# Patient Record
Sex: Female | Born: 1935
Health system: Southern US, Community
[De-identification: ages and names within clinical notes are randomized; demographics above are authoritative.]

---

## 2004-10-20 ENCOUNTER — Ambulatory Visit: Payer: Self-pay | Admitting: Family Medicine

## 2005-02-07 ENCOUNTER — Ambulatory Visit: Payer: Self-pay | Admitting: Family Medicine

## 2005-08-25 ENCOUNTER — Ambulatory Visit: Payer: Self-pay | Admitting: Family Medicine

## 2005-08-28 ENCOUNTER — Ambulatory Visit: Payer: Self-pay | Admitting: Family Medicine

## 2006-03-05 ENCOUNTER — Emergency Department: Payer: Self-pay | Admitting: Emergency Medicine

## 2006-08-23 ENCOUNTER — Ambulatory Visit: Payer: Self-pay

## 2006-09-08 ENCOUNTER — Ambulatory Visit: Payer: Self-pay

## 2006-09-20 ENCOUNTER — Ambulatory Visit: Payer: Self-pay | Admitting: Family Medicine

## 2006-09-28 ENCOUNTER — Ambulatory Visit: Payer: Self-pay | Admitting: Unknown Physician Specialty

## 2006-10-19 ENCOUNTER — Ambulatory Visit: Payer: Self-pay | Admitting: Unknown Physician Specialty

## 2007-03-04 ENCOUNTER — Emergency Department: Payer: Self-pay | Admitting: Emergency Medicine

## 2007-12-14 ENCOUNTER — Ambulatory Visit: Payer: Self-pay | Admitting: Family Medicine

## 2008-01-04 ENCOUNTER — Ambulatory Visit: Payer: Self-pay | Admitting: Family Medicine

## 2008-06-27 IMAGING — CR DG CHEST 2V
1 series · 2 of 2 positions shown · non-contrast
Comparison: none

REASON FOR EXAM: XRAY CHEST FOR SHORTNESS OF BREATH, COUGH
COMMENTS:

[Series 1: view not recorded · 0.17mm/px · 2 of 2 slices shown]
[im 1/2]
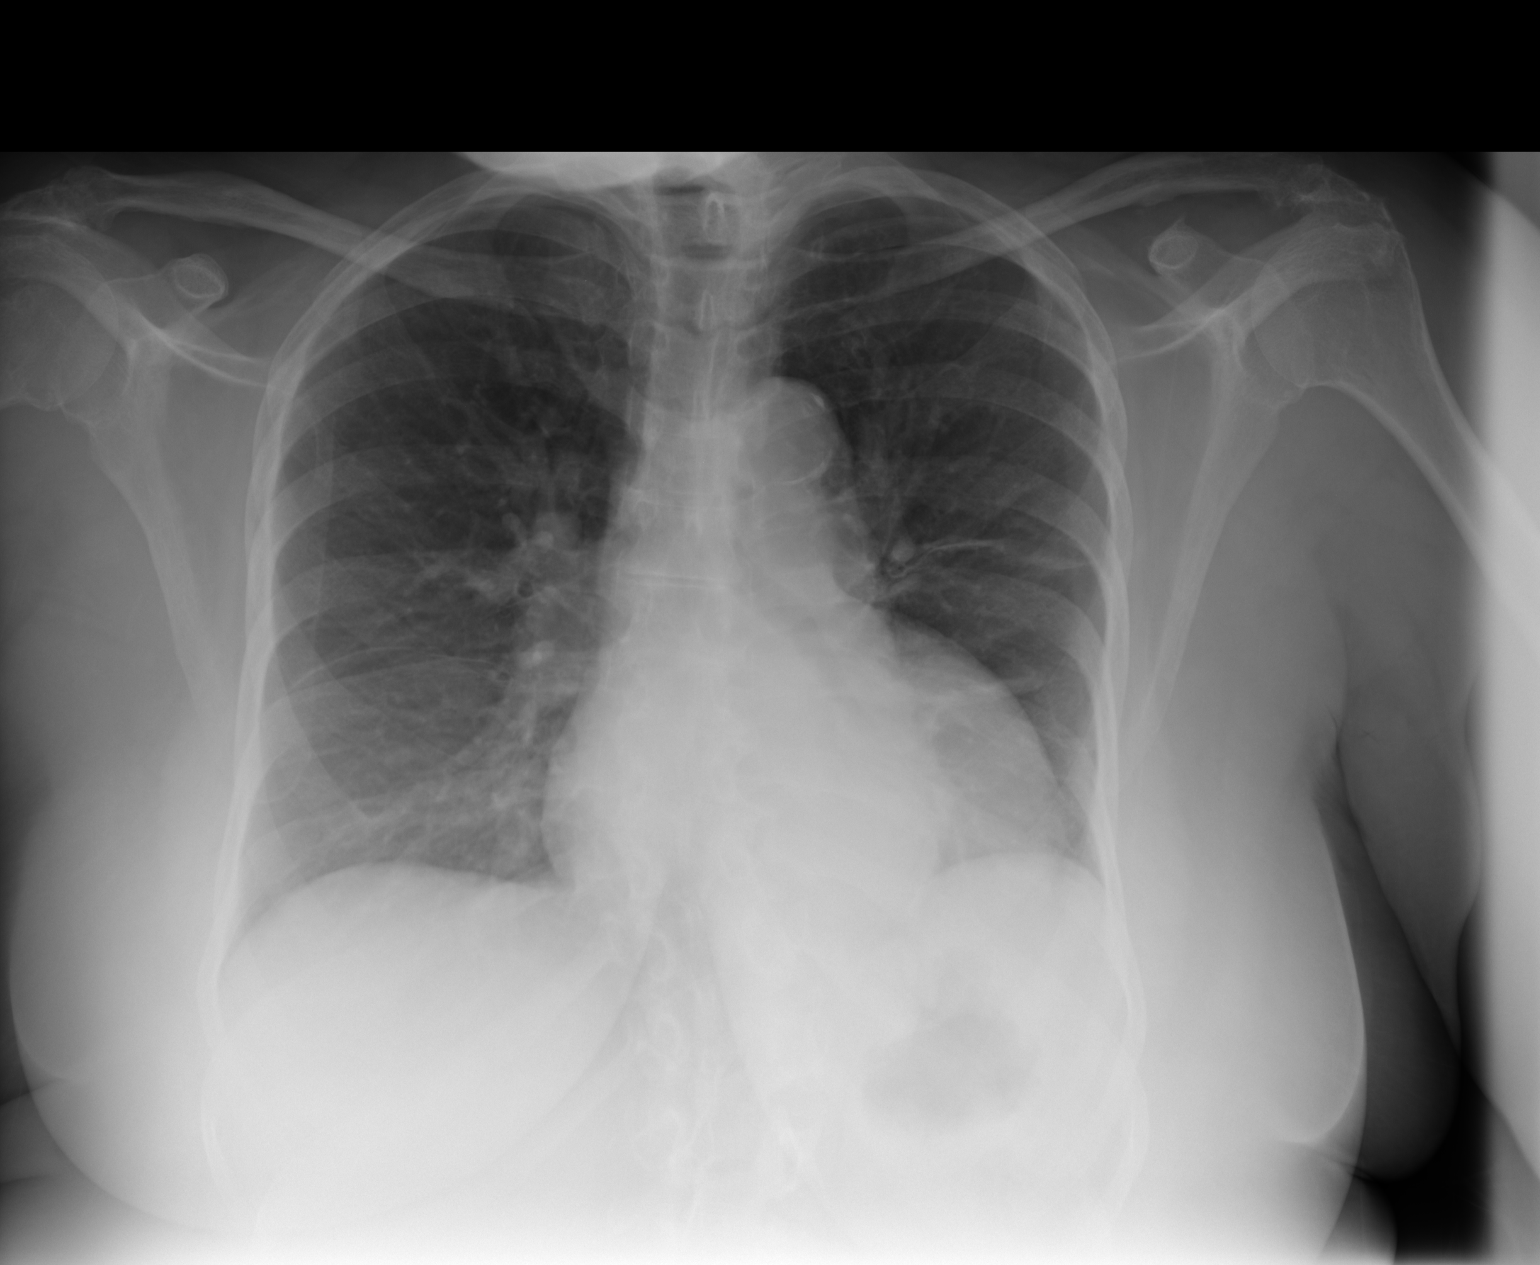
[im 2/2]
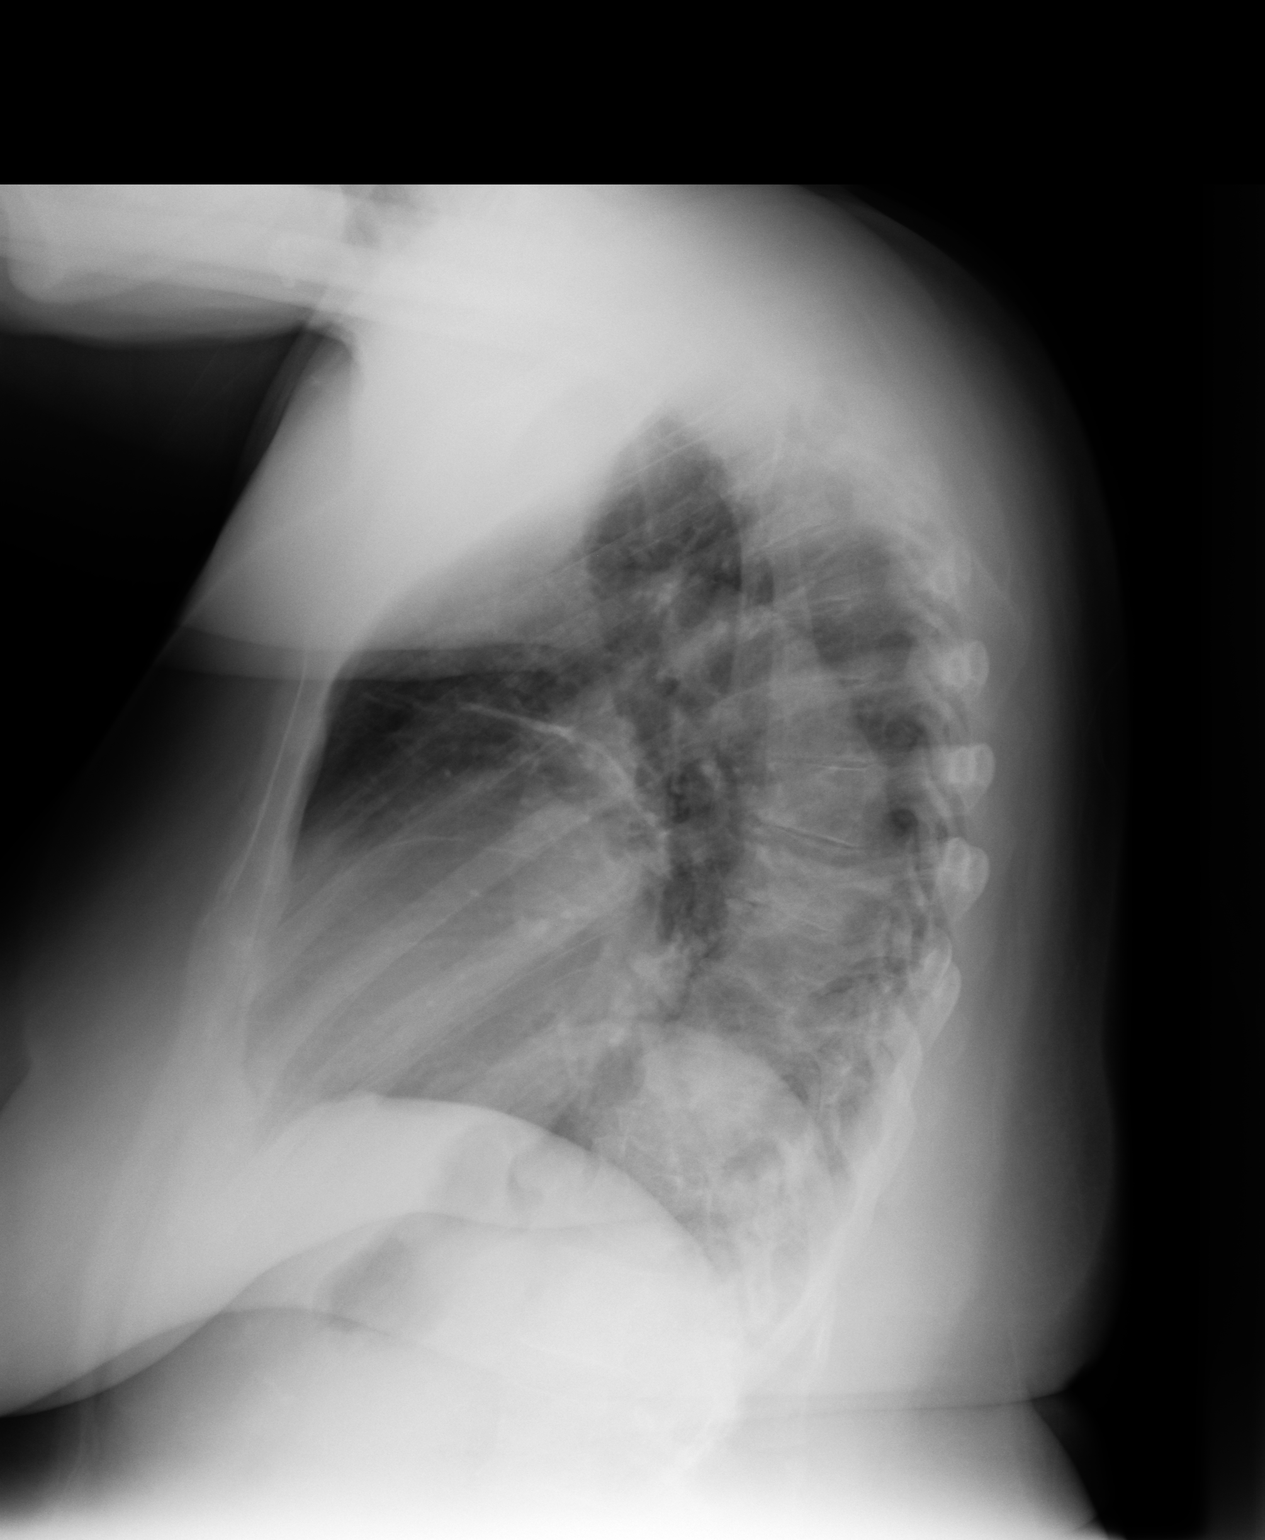

[2 of 2 positions shown; findings below may reference images not displayed]

PROCEDURE:     DXR - DXR CHEST PA (OR AP) AND LATERAL  - August 23, 2006  [DATE]

RESULT:     No prior chest radiographs for comparison.  The current exam
shows prominence of the RIGHT hilum.  This is likely secondary to prominence
of the RIGHT pulmonary artery but CT would be needed to exclude adenopathy.
There are a few linear densities in the LEFT midlung field compatible with
fibrosis or discoid atelectasis.  No pneumonia is seen.  The heart appears
slightly enlarged.  No pleural effusion or pneumothorax is seen.  No acute
bony abnormalities are identified. There is a slight thoracic scoliosis with
convexity to the RIGHT.
IMPRESSION: No pneumonia is identified.

There are a few linear densities in the LEFT midlung field compatible with
fibrosis or discoid atelectasis.

There is prominence of the RIGHT hilum.  This likely is secondary to a
mildly prominent RIGHT pulmonary artery but CT would be recommended to
exclude adenopathy if such is a clinical concern.

## 2008-09-29 ENCOUNTER — Emergency Department: Payer: Self-pay | Admitting: Emergency Medicine

## 2009-01-21 ENCOUNTER — Ambulatory Visit: Payer: Self-pay | Admitting: Internal Medicine

## 2009-08-26 ENCOUNTER — Ambulatory Visit: Payer: Self-pay | Admitting: Family Medicine

## 2010-05-09 ENCOUNTER — Ambulatory Visit: Payer: Self-pay | Admitting: Oncology

## 2010-06-05 ENCOUNTER — Ambulatory Visit: Payer: Self-pay | Admitting: Oncology

## 2010-06-09 ENCOUNTER — Ambulatory Visit: Payer: Self-pay | Admitting: Oncology

## 2010-08-09 ENCOUNTER — Ambulatory Visit: Payer: Self-pay | Admitting: Oncology

## 2010-08-14 ENCOUNTER — Ambulatory Visit: Payer: Self-pay | Admitting: Oncology

## 2010-09-09 ENCOUNTER — Ambulatory Visit: Payer: Self-pay | Admitting: Oncology

## 2010-10-09 ENCOUNTER — Ambulatory Visit: Payer: Self-pay | Admitting: Oncology

## 2010-11-09 ENCOUNTER — Ambulatory Visit: Payer: Self-pay | Admitting: Oncology

## 2010-12-10 ENCOUNTER — Ambulatory Visit: Payer: Self-pay | Admitting: Oncology

## 2011-01-08 ENCOUNTER — Ambulatory Visit: Payer: Self-pay | Admitting: Oncology

## 2011-02-08 ENCOUNTER — Ambulatory Visit: Payer: Self-pay | Admitting: Oncology

## 2011-03-10 ENCOUNTER — Ambulatory Visit: Payer: Self-pay | Admitting: Oncology

## 2011-03-10 ENCOUNTER — Ambulatory Visit: Payer: Self-pay | Admitting: Internal Medicine

## 2011-04-10 ENCOUNTER — Ambulatory Visit: Payer: Self-pay | Admitting: Oncology

## 2011-05-10 ENCOUNTER — Ambulatory Visit: Payer: Self-pay | Admitting: Oncology

## 2011-06-10 ENCOUNTER — Ambulatory Visit: Payer: Self-pay | Admitting: Oncology

## 2011-06-21 ENCOUNTER — Inpatient Hospital Stay: Payer: Self-pay | Admitting: Internal Medicine

## 2011-06-25 LAB — PATHOLOGY REPORT

## 2011-06-29 ENCOUNTER — Encounter: Payer: Self-pay | Admitting: Internal Medicine

## 2011-07-11 ENCOUNTER — Encounter: Payer: Self-pay | Admitting: Internal Medicine

## 2011-07-11 ENCOUNTER — Ambulatory Visit: Payer: Self-pay | Admitting: Oncology

## 2011-07-29 ENCOUNTER — Ambulatory Visit: Payer: Self-pay | Admitting: Gastroenterology

## 2011-08-10 ENCOUNTER — Ambulatory Visit: Payer: Self-pay | Admitting: Oncology

## 2011-08-10 ENCOUNTER — Encounter: Payer: Self-pay | Admitting: Internal Medicine

## 2011-09-10 ENCOUNTER — Ambulatory Visit: Payer: Self-pay | Admitting: Oncology

## 2011-09-10 ENCOUNTER — Encounter: Payer: Self-pay | Admitting: Internal Medicine

## 2011-10-02 ENCOUNTER — Ambulatory Visit: Payer: Self-pay | Admitting: Nephrology

## 2011-10-07 ENCOUNTER — Ambulatory Visit: Payer: Self-pay | Admitting: Vascular Surgery

## 2011-10-07 DIAGNOSIS — I10 Essential (primary) hypertension: Secondary | ICD-10-CM

## 2011-10-10 ENCOUNTER — Ambulatory Visit: Payer: Self-pay | Admitting: Oncology

## 2011-10-10 ENCOUNTER — Ambulatory Visit: Payer: Self-pay | Admitting: Internal Medicine

## 2011-10-10 ENCOUNTER — Encounter: Payer: Self-pay | Admitting: Internal Medicine

## 2011-10-10 ENCOUNTER — Inpatient Hospital Stay: Payer: Self-pay | Admitting: Internal Medicine

## 2011-10-30 ENCOUNTER — Inpatient Hospital Stay: Payer: Self-pay | Admitting: Internal Medicine

## 2011-11-10 ENCOUNTER — Ambulatory Visit: Payer: Self-pay | Admitting: Internal Medicine

## 2011-11-10 ENCOUNTER — Ambulatory Visit: Payer: Self-pay | Admitting: Oncology

## 2011-11-10 ENCOUNTER — Encounter: Payer: Self-pay | Admitting: Internal Medicine

## 2011-11-15 ENCOUNTER — Inpatient Hospital Stay: Payer: Self-pay | Admitting: *Deleted

## 2011-11-15 LAB — BASIC METABOLIC PANEL
Anion Gap: 12 (ref 7–16)
Calcium, Total: 8.9 mg/dL (ref 8.5–10.1)
Co2: 23 mmol/L (ref 21–32)
Creatinine: 3.02 mg/dL — ABNORMAL HIGH (ref 0.60–1.30)
EGFR (African American): 19 — ABNORMAL LOW
EGFR (Non-African Amer.): 16 — ABNORMAL LOW
Glucose: 155 mg/dL — ABNORMAL HIGH (ref 65–99)
Potassium: 4.5 mmol/L (ref 3.5–5.1)
Sodium: 145 mmol/L (ref 136–145)

## 2011-11-15 LAB — CBC
HCT: 25.1 % — ABNORMAL LOW (ref 35.0–47.0)
HGB: 8.4 g/dL — ABNORMAL LOW (ref 12.0–16.0)
MCV: 93 fL (ref 80–100)

## 2011-11-15 LAB — TROPONIN I: Troponin-I: 0.23 ng/mL — ABNORMAL HIGH

## 2011-11-16 LAB — BASIC METABOLIC PANEL
BUN: 69 mg/dL — ABNORMAL HIGH (ref 7–18)
Calcium, Total: 9 mg/dL (ref 8.5–10.1)
EGFR (African American): 21 — ABNORMAL LOW
EGFR (Non-African Amer.): 17 — ABNORMAL LOW
Glucose: 112 mg/dL — ABNORMAL HIGH (ref 65–99)
Osmolality: 311 (ref 275–301)
Potassium: 4.2 mmol/L (ref 3.5–5.1)

## 2011-11-16 LAB — CBC WITH DIFFERENTIAL/PLATELET
Basophil #: 0 10*3/uL (ref 0.0–0.1)
Basophil %: 0.1 %
Eosinophil #: 0 10*3/uL (ref 0.0–0.7)
Eosinophil %: 0.3 %
Lymphocyte #: 0.6 10*3/uL — ABNORMAL LOW (ref 1.0–3.6)
MCH: 30.5 pg (ref 26.0–34.0)
MCV: 93 fL (ref 80–100)
Platelet: 285 10*3/uL (ref 150–440)
RDW: 17.2 % — ABNORMAL HIGH (ref 11.5–14.5)
WBC: 7 10*3/uL (ref 3.6–11.0)

## 2011-11-17 LAB — HEMOGLOBIN: HGB: 7.8 g/dL — ABNORMAL LOW (ref 12.0–16.0)

## 2011-11-18 LAB — BASIC METABOLIC PANEL
BUN: 62 mg/dL — ABNORMAL HIGH (ref 7–18)
Creatinine: 2.72 mg/dL — ABNORMAL HIGH (ref 0.60–1.30)
EGFR (African American): 21 — ABNORMAL LOW
EGFR (Non-African Amer.): 18 — ABNORMAL LOW
Glucose: 87 mg/dL (ref 65–99)
Potassium: 4 mmol/L (ref 3.5–5.1)
Sodium: 139 mmol/L (ref 136–145)

## 2011-11-20 LAB — CULTURE, BLOOD (SINGLE)

## 2011-12-01 LAB — CBC CANCER CENTER
Basophil #: 0 x10 3/mm (ref 0.0–0.1)
Basophil %: 0.3 %
Eosinophil #: 0.1 x10 3/mm (ref 0.0–0.7)
HGB: 8.1 g/dL — ABNORMAL LOW (ref 12.0–16.0)
Lymphocyte #: 1.5 x10 3/mm (ref 1.0–3.6)
Lymphocyte %: 16.2 %
MCH: 30 pg (ref 26.0–34.0)
MCHC: 33.4 g/dL (ref 32.0–36.0)
MCV: 90 fL (ref 80–100)
Monocyte #: 0.1 x10 3/mm (ref 0.0–0.7)
Neutrophil #: 7.7 x10 3/mm — ABNORMAL HIGH (ref 1.4–6.5)
Neutrophil %: 80.8 %
RDW: 17.2 % — ABNORMAL HIGH (ref 11.5–14.5)

## 2011-12-11 ENCOUNTER — Encounter: Payer: Self-pay | Admitting: Internal Medicine

## 2011-12-11 ENCOUNTER — Ambulatory Visit: Payer: Self-pay | Admitting: Oncology

## 2011-12-22 LAB — IRON AND TIBC
Iron Bind.Cap.(Total): 145 ug/dL — ABNORMAL LOW (ref 250–450)
Iron Saturation: 46 %
Unbound Iron-Bind.Cap.: 79 ug/dL

## 2011-12-22 LAB — CBC CANCER CENTER
Basophil %: 0.5 %
Eosinophil #: 0.4 x10 3/mm (ref 0.0–0.7)
Eosinophil %: 5 %
HCT: 20.9 % — ABNORMAL LOW (ref 35.0–47.0)
HGB: 7.1 g/dL — ABNORMAL LOW (ref 12.0–16.0)
Lymphocyte #: 1.6 x10 3/mm (ref 1.0–3.6)
MCH: 31.4 pg (ref 26.0–34.0)
MCHC: 34.1 g/dL (ref 32.0–36.0)
MCV: 92 fL (ref 80–100)
Monocyte #: 0 x10 3/mm (ref 0.0–0.7)
Neutrophil #: 5.5 x10 3/mm (ref 1.4–6.5)
Platelet: 439 x10 3/mm (ref 150–440)
RBC: 2.27 10*6/uL — ABNORMAL LOW (ref 3.80–5.20)
WBC: 7.6 x10 3/mm (ref 3.6–11.0)

## 2012-01-07 LAB — CBC CANCER CENTER
Basophil #: 0 x10 3/mm (ref 0.0–0.1)
Basophil %: 0.1 %
Eosinophil #: 0.2 x10 3/mm (ref 0.0–0.7)
Eosinophil %: 3.3 %
Lymphocyte %: 19.1 %
MCH: 30.4 pg (ref 26.0–34.0)
MCHC: 33.9 g/dL (ref 32.0–36.0)
MCV: 90 fL (ref 80–100)
Monocyte %: 0.6 %
Neutrophil #: 4.6 x10 3/mm (ref 1.4–6.5)
Neutrophil %: 76.9 %
RDW: 16.5 % — ABNORMAL HIGH (ref 11.5–14.5)
WBC: 6 x10 3/mm (ref 3.6–11.0)

## 2012-01-07 LAB — IRON AND TIBC
Iron Bind.Cap.(Total): 94 ug/dL — ABNORMAL LOW (ref 250–450)
Iron: 36 ug/dL — ABNORMAL LOW (ref 50–170)

## 2012-01-08 ENCOUNTER — Ambulatory Visit: Payer: Self-pay | Admitting: Oncology

## 2012-02-05 ENCOUNTER — Inpatient Hospital Stay: Payer: Self-pay | Admitting: Internal Medicine

## 2012-02-05 LAB — COMPREHENSIVE METABOLIC PANEL
Albumin: 1.5 g/dL — ABNORMAL LOW (ref 3.4–5.0)
Anion Gap: 14 (ref 7–16)
Bilirubin,Total: 0.3 mg/dL (ref 0.2–1.0)
Calcium, Total: 7.5 mg/dL — ABNORMAL LOW (ref 8.5–10.1)
Chloride: 115 mmol/L — ABNORMAL HIGH (ref 98–107)
Creatinine: 2.72 mg/dL — ABNORMAL HIGH (ref 0.60–1.30)
EGFR (African American): 21 — ABNORMAL LOW
Potassium: 5.3 mmol/L — ABNORMAL HIGH (ref 3.5–5.1)
SGOT(AST): 16 U/L (ref 15–37)
SGPT (ALT): 11 U/L — ABNORMAL LOW
Sodium: 142 mmol/L (ref 136–145)

## 2012-02-05 LAB — CK TOTAL AND CKMB (NOT AT ARMC)
CK, Total: 66 U/L (ref 21–215)
CK-MB: 5 ng/mL — ABNORMAL HIGH (ref 0.5–3.6)

## 2012-02-05 LAB — CBC
MCHC: 32.5 g/dL (ref 32.0–36.0)
Platelet: 336 10*3/uL (ref 150–440)
WBC: 5.5 10*3/uL (ref 3.6–11.0)

## 2012-02-05 LAB — PRO B NATRIURETIC PEPTIDE: B-Type Natriuretic Peptide: 37404 pg/mL — ABNORMAL HIGH (ref 0–450)

## 2012-02-06 LAB — TROPONIN I: Troponin-I: 0.23 ng/mL — ABNORMAL HIGH

## 2012-02-06 LAB — OCCULT BLOOD X 1 CARD TO LAB, STOOL: Occult Blood, Feces: NEGATIVE

## 2012-02-06 LAB — CK TOTAL AND CKMB (NOT AT ARMC): CK-MB: 4.9 ng/mL — ABNORMAL HIGH (ref 0.5–3.6)

## 2012-02-07 LAB — BASIC METABOLIC PANEL
BUN: 62 mg/dL — ABNORMAL HIGH (ref 7–18)
Calcium, Total: 7.7 mg/dL — ABNORMAL LOW (ref 8.5–10.1)
Creatinine: 2.5 mg/dL — ABNORMAL HIGH (ref 0.60–1.30)
EGFR (African American): 24 — ABNORMAL LOW
Glucose: 145 mg/dL — ABNORMAL HIGH (ref 65–99)
Osmolality: 303 (ref 275–301)
Sodium: 142 mmol/L (ref 136–145)

## 2012-02-07 LAB — CBC WITH DIFFERENTIAL/PLATELET
Basophil #: 0 10*3/uL (ref 0.0–0.1)
Basophil %: 0.3 %
Eosinophil %: 0.7 %
HCT: 19.1 % — ABNORMAL LOW (ref 35.0–47.0)
HGB: 6.3 g/dL — ABNORMAL LOW (ref 12.0–16.0)
MCH: 29.5 pg (ref 26.0–34.0)
MCV: 90 fL (ref 80–100)
Monocyte #: 0 10*3/uL (ref 0.0–0.7)
Monocyte %: 1 %
RBC: 2.13 10*6/uL — ABNORMAL LOW (ref 3.80–5.20)
RDW: 16.2 % — ABNORMAL HIGH (ref 11.5–14.5)

## 2012-02-08 LAB — BASIC METABOLIC PANEL
Anion Gap: 10 (ref 7–16)
BUN: 61 mg/dL — ABNORMAL HIGH (ref 7–18)
Calcium, Total: 7.9 mg/dL — ABNORMAL LOW (ref 8.5–10.1)
Chloride: 111 mmol/L — ABNORMAL HIGH (ref 98–107)
Co2: 20 mmol/L — ABNORMAL LOW (ref 21–32)
Creatinine: 2.41 mg/dL — ABNORMAL HIGH (ref 0.60–1.30)
EGFR (African American): 25 — ABNORMAL LOW
Glucose: 90 mg/dL (ref 65–99)
Potassium: 4.1 mmol/L (ref 3.5–5.1)
Sodium: 141 mmol/L (ref 136–145)

## 2012-02-08 LAB — CBC WITH DIFFERENTIAL/PLATELET
Basophil %: 0.3 %
Eosinophil #: 0 10*3/uL (ref 0.0–0.7)
HCT: 22.9 % — ABNORMAL LOW (ref 35.0–47.0)
HGB: 7.8 g/dL — ABNORMAL LOW (ref 12.0–16.0)
Lymphocyte #: 1.2 10*3/uL (ref 1.0–3.6)
Lymphocyte %: 20.1 %
MCH: 29.9 pg (ref 26.0–34.0)
MCHC: 34 g/dL (ref 32.0–36.0)
Monocyte #: 0 10*3/uL (ref 0.0–0.7)
Neutrophil %: 78.1 %
RBC: 2.6 10*6/uL — ABNORMAL LOW (ref 3.80–5.20)
RDW: 16 % — ABNORMAL HIGH (ref 11.5–14.5)
WBC: 5.8 10*3/uL (ref 3.6–11.0)

## 2012-02-09 LAB — BASIC METABOLIC PANEL
Anion Gap: 11 (ref 7–16)
BUN: 62 mg/dL — ABNORMAL HIGH (ref 7–18)
Calcium, Total: 8.1 mg/dL — ABNORMAL LOW (ref 8.5–10.1)
Co2: 18 mmol/L — ABNORMAL LOW (ref 21–32)
Creatinine: 2.4 mg/dL — ABNORMAL HIGH (ref 0.60–1.30)
EGFR (African American): 25 — ABNORMAL LOW
Glucose: 101 mg/dL — ABNORMAL HIGH (ref 65–99)
Potassium: 4.2 mmol/L (ref 3.5–5.1)
Sodium: 140 mmol/L (ref 136–145)

## 2012-02-09 LAB — CBC WITH DIFFERENTIAL/PLATELET
Basophil #: 0 10*3/uL (ref 0.0–0.1)
Basophil %: 0.4 %
Eosinophil #: 0.1 10*3/uL (ref 0.0–0.7)
Eosinophil %: 0.8 %
HCT: 29.7 % — ABNORMAL LOW (ref 35.0–47.0)
HGB: 9.9 g/dL — ABNORMAL LOW (ref 12.0–16.0)
Lymphocyte #: 1.4 10*3/uL (ref 1.0–3.6)
Lymphocyte %: 21.1 %
MCH: 29.6 pg (ref 26.0–34.0)
MCHC: 33.2 g/dL (ref 32.0–36.0)
MCV: 89 fL (ref 80–100)
Monocyte #: 0.1 10*3/uL (ref 0.0–0.7)
Monocyte %: 1.7 %
Platelet: 361 10*3/uL (ref 150–440)
WBC: 6.7 10*3/uL (ref 3.6–11.0)

## 2012-03-09 DEATH — deceased

## 2013-05-01 IMAGING — CT CT ABD-PELV W/O CM
1 of 2 series · 15 of 32 positions shown, 19 images · non-contrast
Comparison: none

REASON FOR EXAM: (1) anemia, heme positive- ORAL CONTRAST ONLY; (2)
anemia, heme positive- ORAL C
COMMENTS:

PROCEDURE:     CT  - CT ABDOMEN AND PELVIS W[DATE] [DATE]
RESULT:     History: Anemia. Prograf comparison study: No recent prior.

[Series 2: 3mm soft tissue · axial · 0.71mm/px · z∈[-664,-295]mm · 15 of 135 slices shown, 19 images]
[im 6/135  soft-tissue]
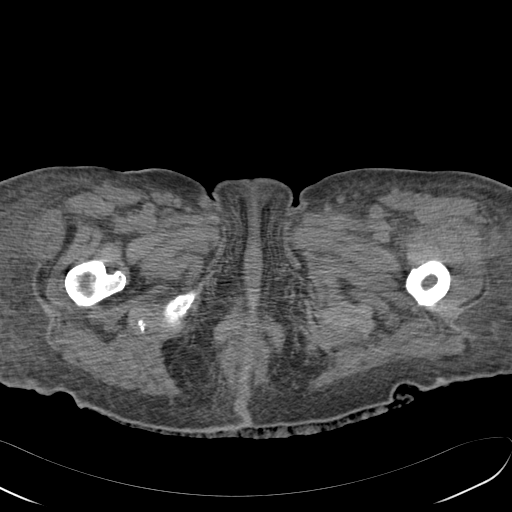
[im 6/135  bone]
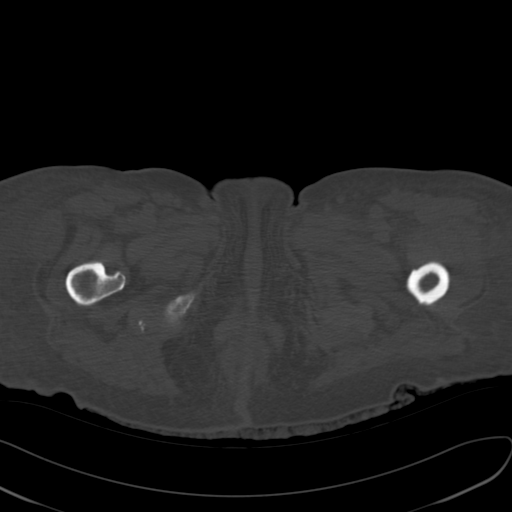
[im 17/135  soft-tissue]
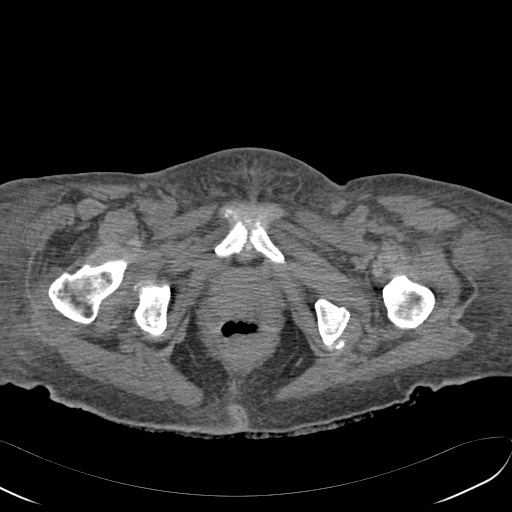
[im 27/135  soft-tissue]
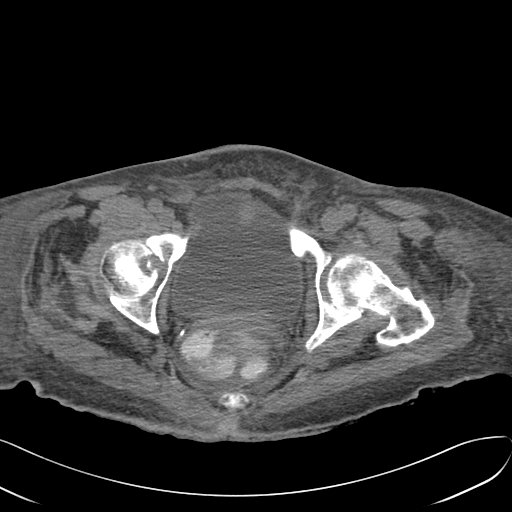
[im 38/135  soft-tissue]
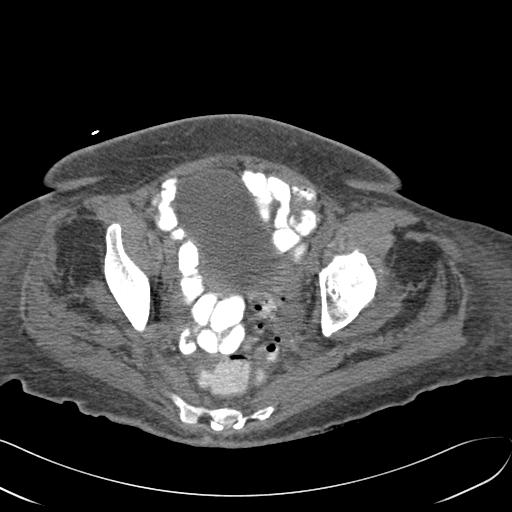
[im 49/135  soft-tissue]
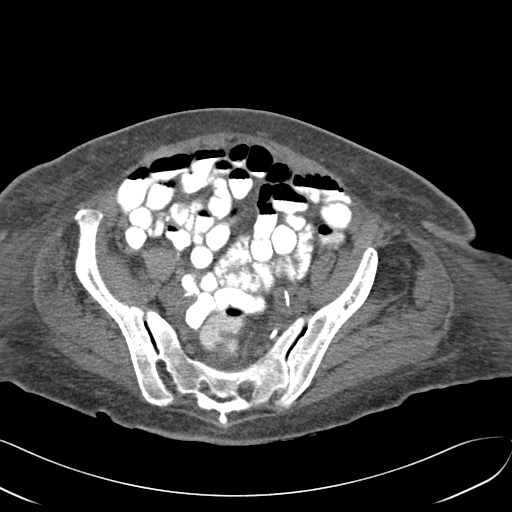
[im 59/135  soft-tissue]
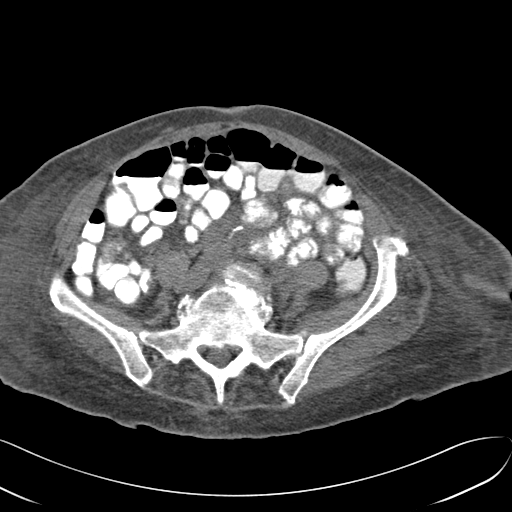
[im 70/135  soft-tissue]
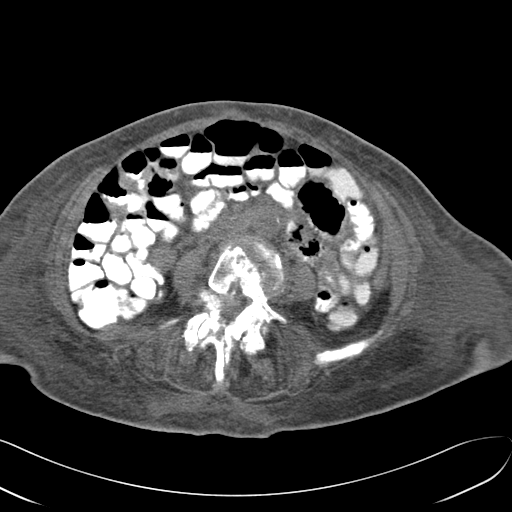
[im 76/135  soft-tissue]
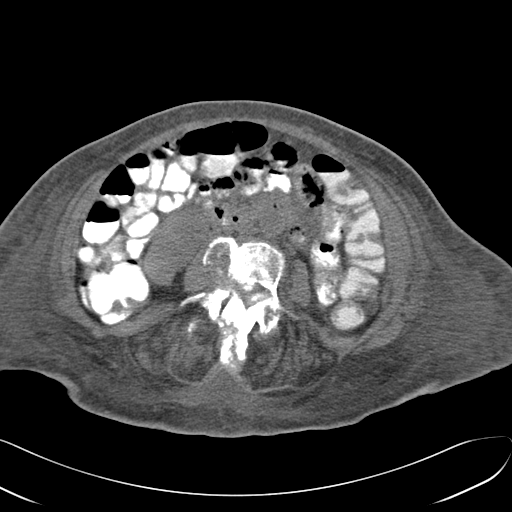
[im 86/135  soft-tissue]
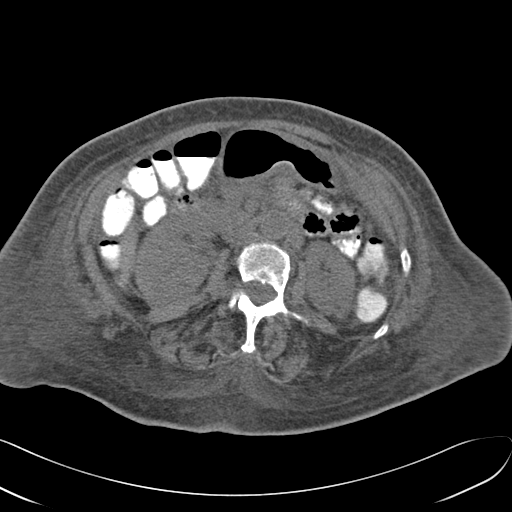
[im 86/135  bone]
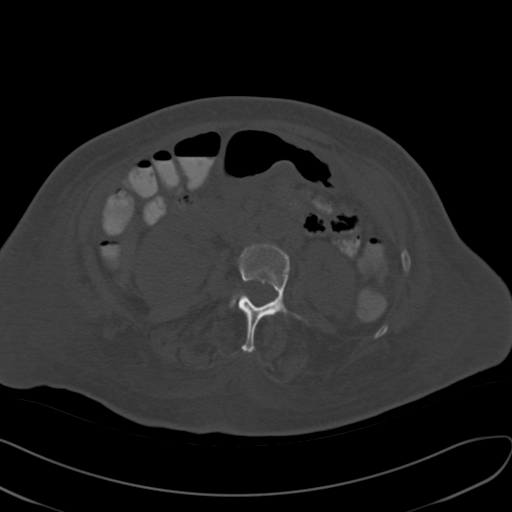
[im 97/135  soft-tissue]
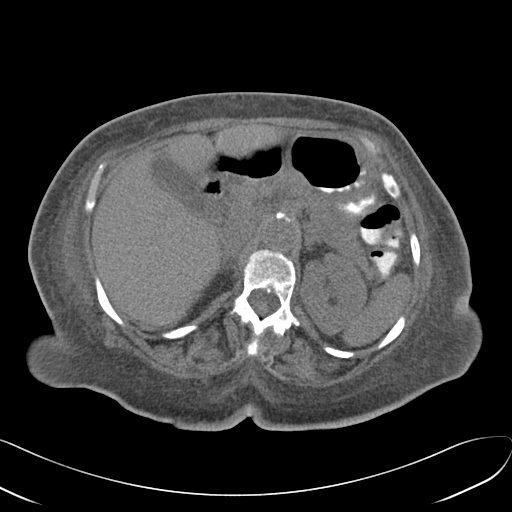
[im 108/135  soft-tissue]
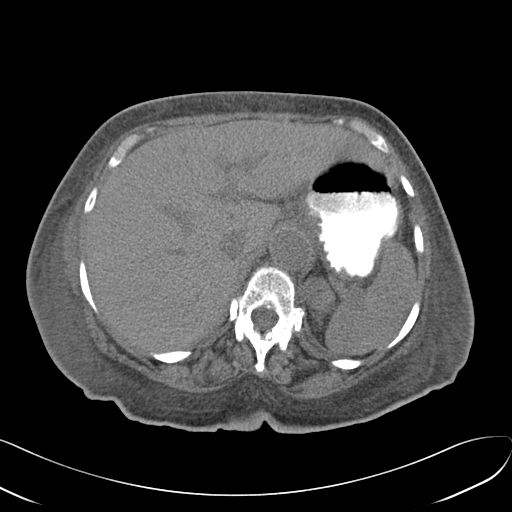
[im 113/135  lung]
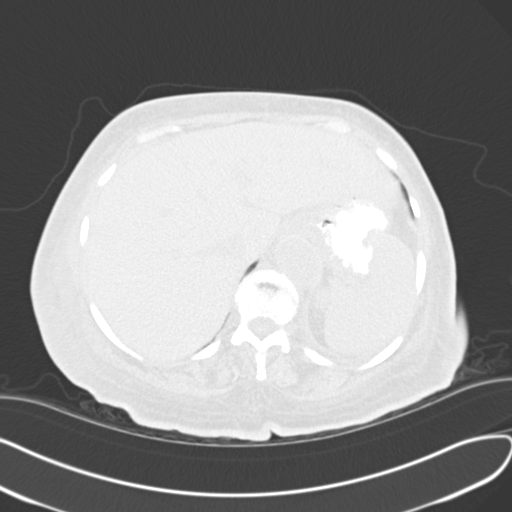
[im 118/135  soft-tissue]
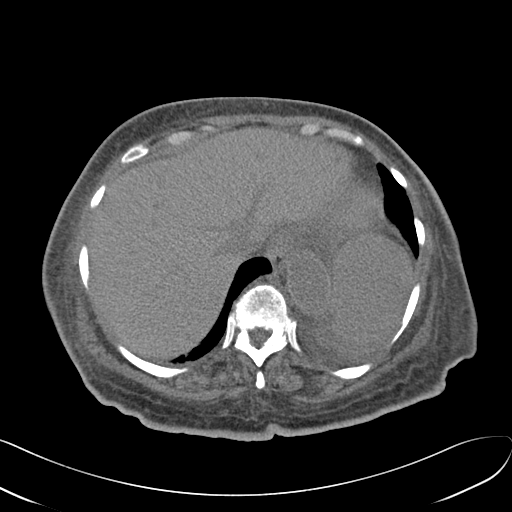
[im 118/135  lung]
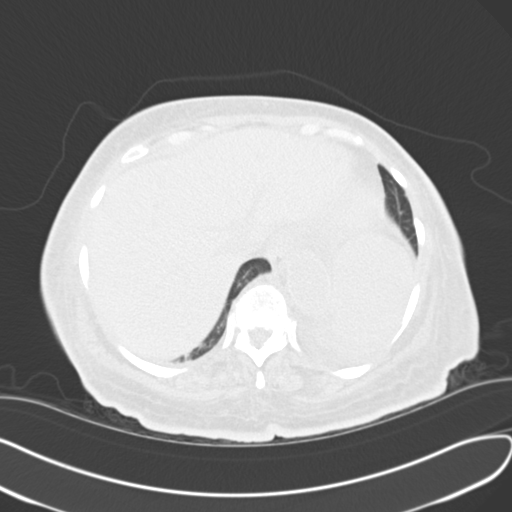
[im 124/135  lung]
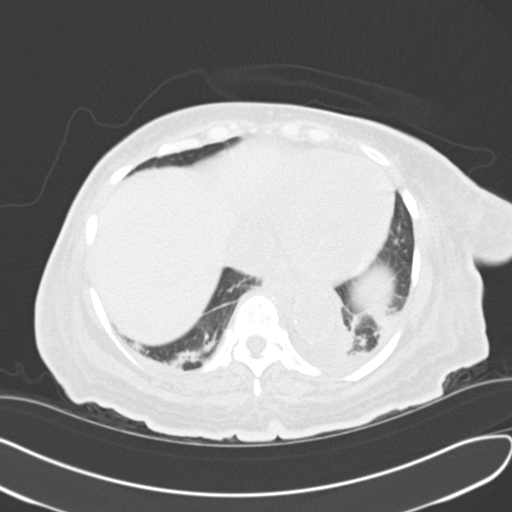
[im 129/135  soft-tissue]
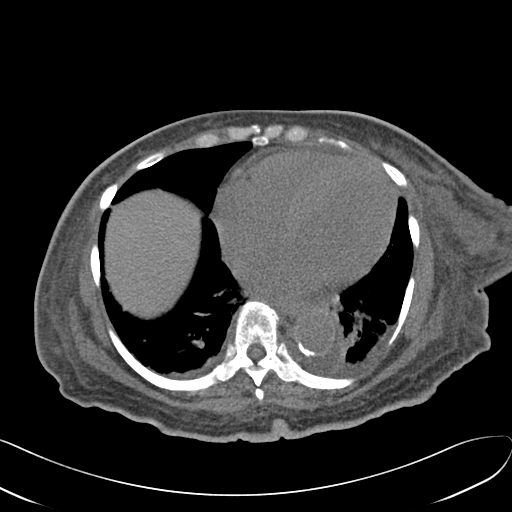
[im 129/135  lung]
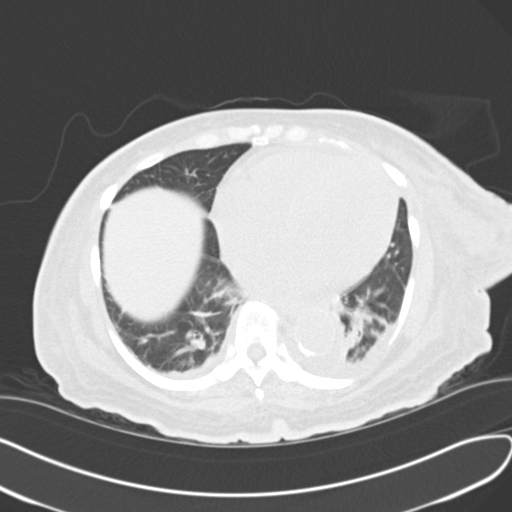

[15 of 32 positions shown; findings below may reference images not displayed]

FINDINGS: Standard nonenhanced CT obtained. Liver normal. Gallbladder
nondistended. Sludge in gallbladder cannot be excluded. Spleen normal.
Pancreas normal. No biliary distention. Adrenals normal. Right lower pole
renal mass cannot be excluded. Left kidney upper pole mass cannot be
excluded.. No hydronephrosis. Aorta nondistended. Appendix normal. Bowel
nondistended. Bladder intact. Colonic diverticulosis noted. Diffuse
anasarca. Bilateral lung base atelectasis present. No free air. Trace
bilateral pleural effusions. Exam limited by paucity of fat and absence of
IV contrast.
IMPRESSION: Cannot exclude right lower renal pole and left upper renal
pole mass lesion. Possible sludge in gallbladder. Diffuse anasarca. Tracer
bilateral pleural effusions. Bilateral lower lobe atelectasis.

## 2013-05-03 IMAGING — US US RENAL KIDNEY
1 series · 17 of 25 positions shown · non-contrast
Comparison: none

REASON FOR EXAM: possible bilateral renal masses, please evaluate.
COMMENTS:

[Series 1: us renal kidney · 17 of 50 slices shown]
[im 1/50]
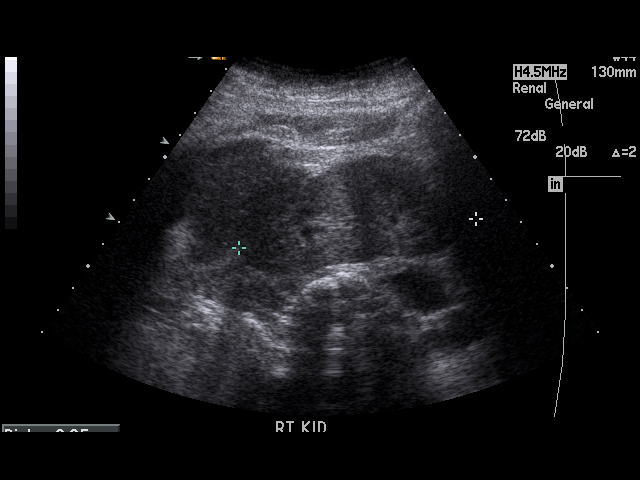
[im 5/50]
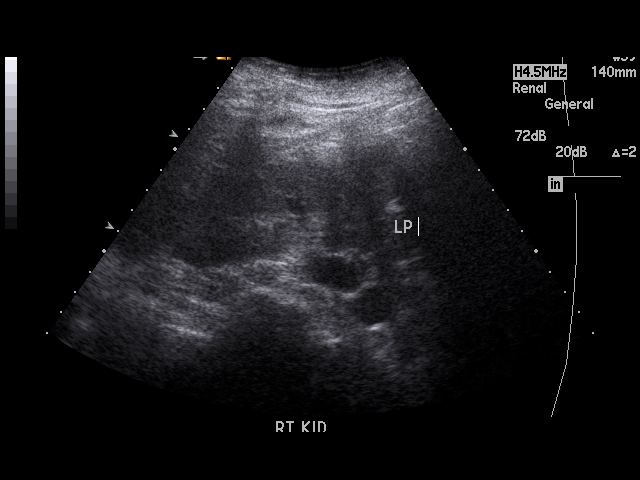
[im 7/50]
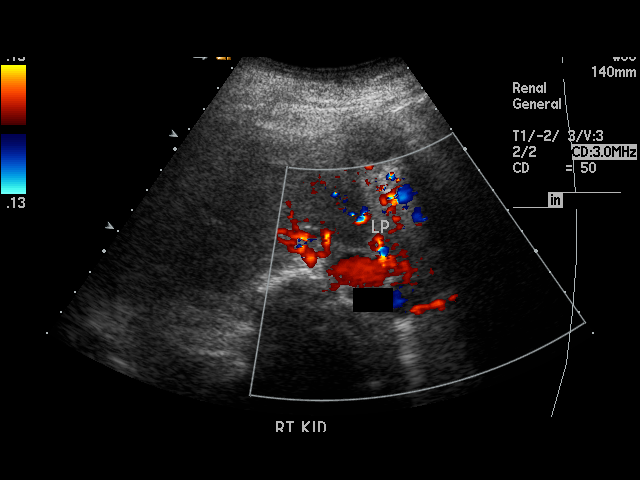
[im 11/50]
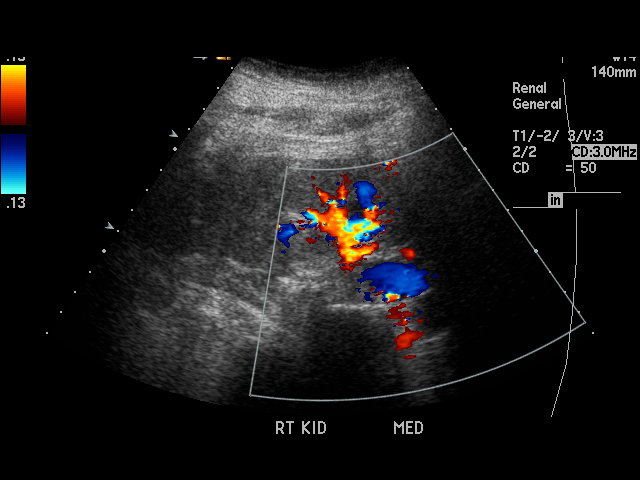
[im 13/50]
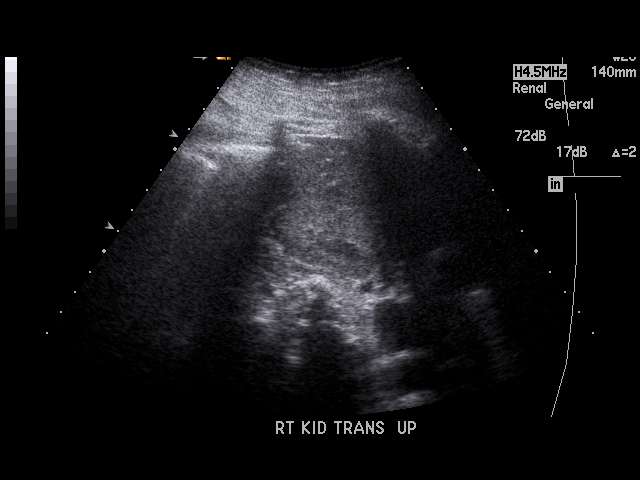
[im 17/50]
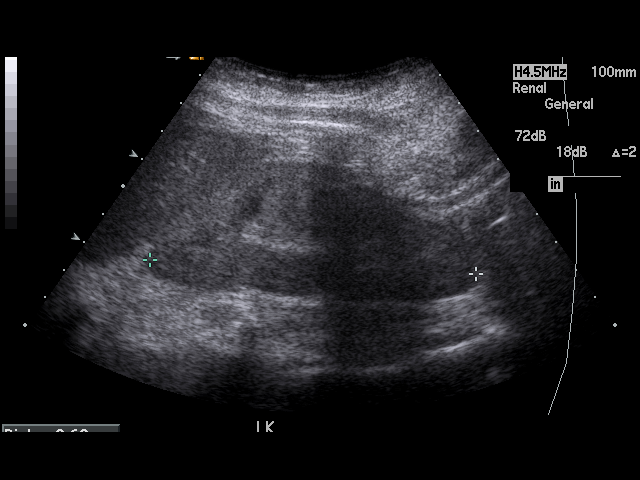
[im 19/50]
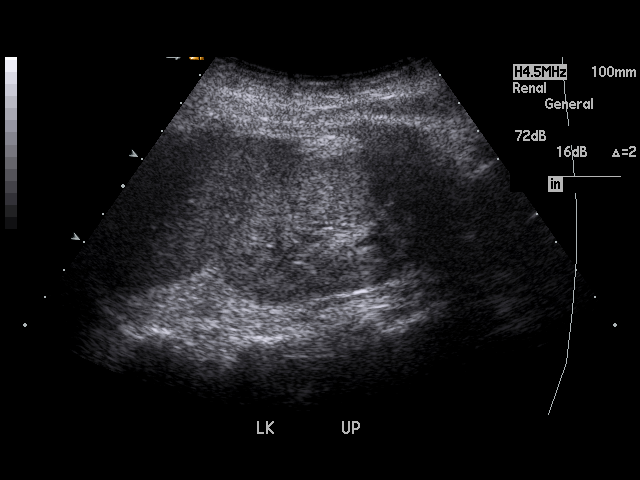
[im 23/50]
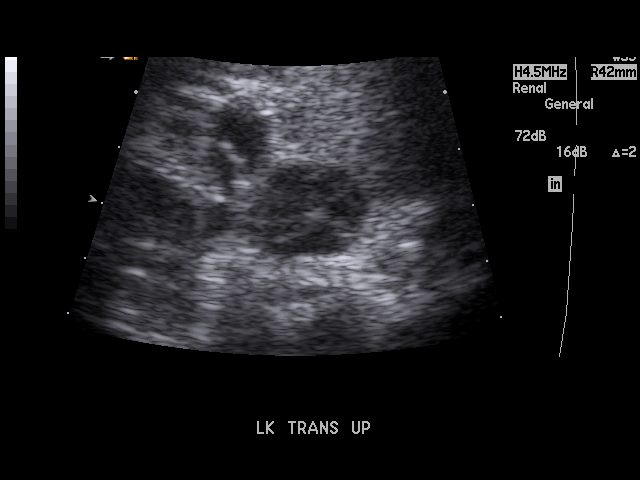
[im 25/50]
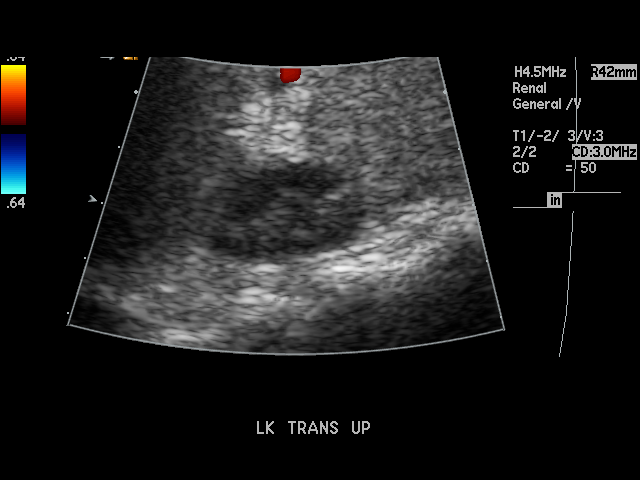
[im 27/50]
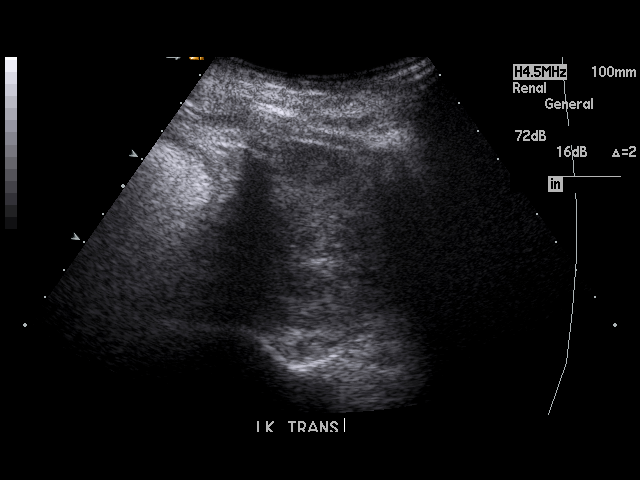
[im 31/50]
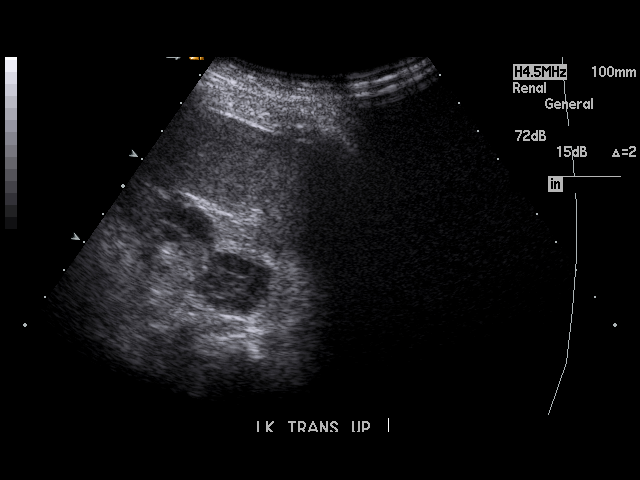
[im 33/50]
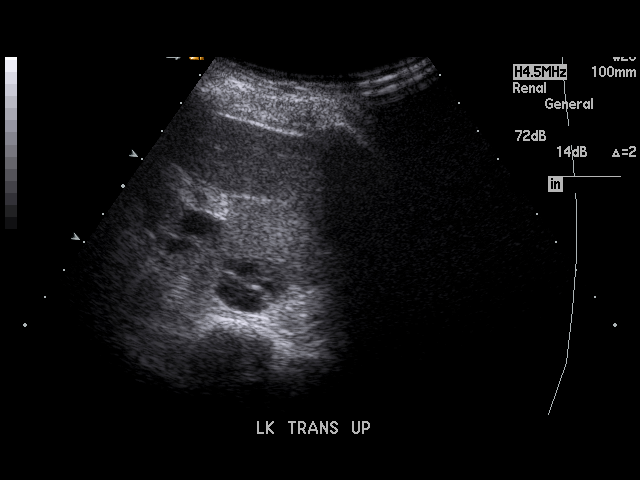
[im 37/50]
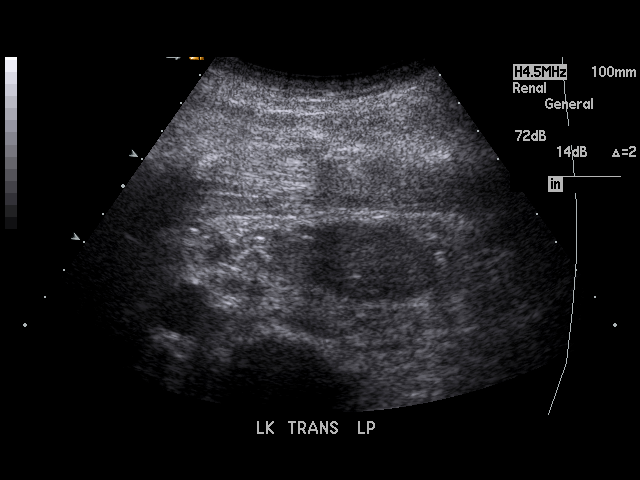
[im 39/50]
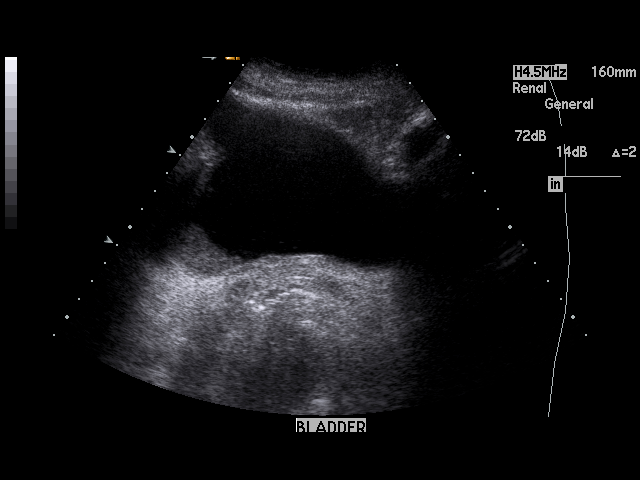
[im 43/50]
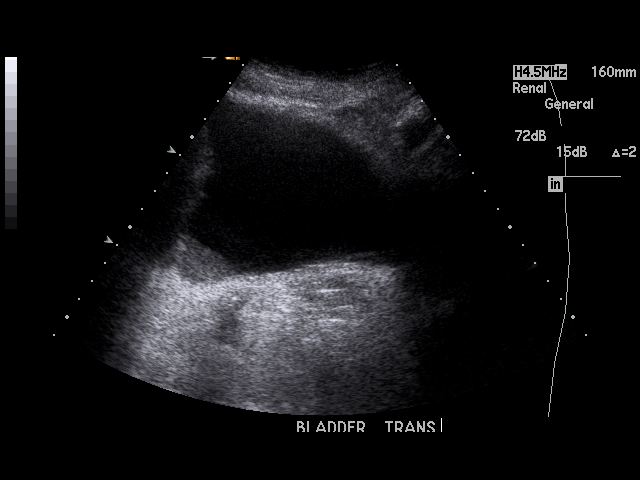
[im 45/50]
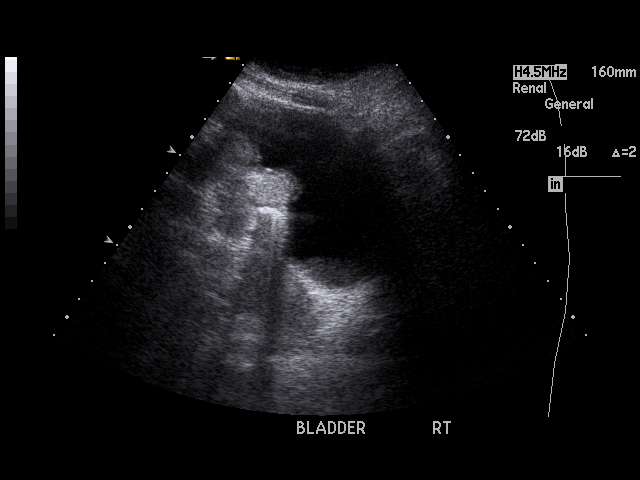
[im 50/50]
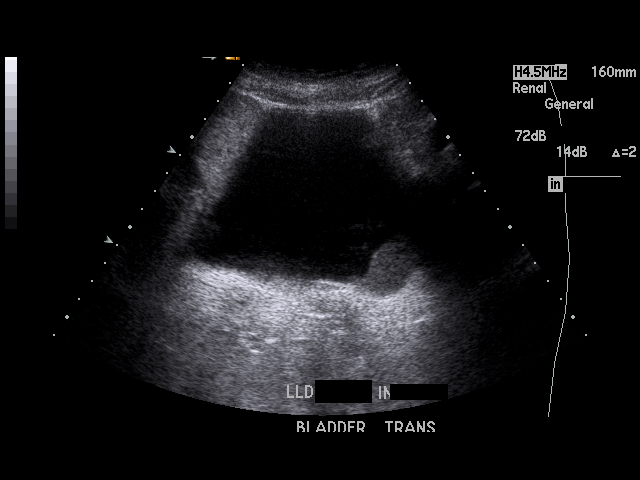

[17 of 25 positions shown; findings below may reference images not displayed]

PROCEDURE:     US  - US KIDNEY  - June 29, 2011  [DATE]

RESULT:     The right kidney measures 8.95 cm x 4.02 cm x 4.02 cm and the
left kidney measures 10.24 cm x 4.03 cm x 4.52 cm. The right kidney is low
in position and appears to be somewhat malrotated. At the upper pole of the
left kidney there is a 2.61 cm complex mass. No associated vascularity is
seen on Doppler examination. The internal echoes appear to move which
suggest this may represent a cyst containing debris. Follow-up examination
to document stability or further evaluation by CT is suggested. No other
renal masses are seen. No hydronephrosis is observed on either side. No
renal calcifications are noted. The visualized portion of the urinary
bladder reveals no significant abnormalities. Bilateral ureteral flow jets
are seen.
IMPRESSION: 1. The right kidney is smaller than the left and appears to be low in
position and possibly partially malrotated.
2. There is a 2.61 cm hypoechoic complex mass at the upper pole of the left
kidney. The nature of this mass is not certain sonographically, but the
internal echoes appear to move which suggest a cyst containing debris.
3. There is no hydronephrosis.

## 2013-09-19 IMAGING — CR DG CHEST 1V PORT
1 series · 1 of 1 positions shown · non-contrast
Comparison: none

REASON FOR EXAM: COUGH
COMMENTS:

[portable]
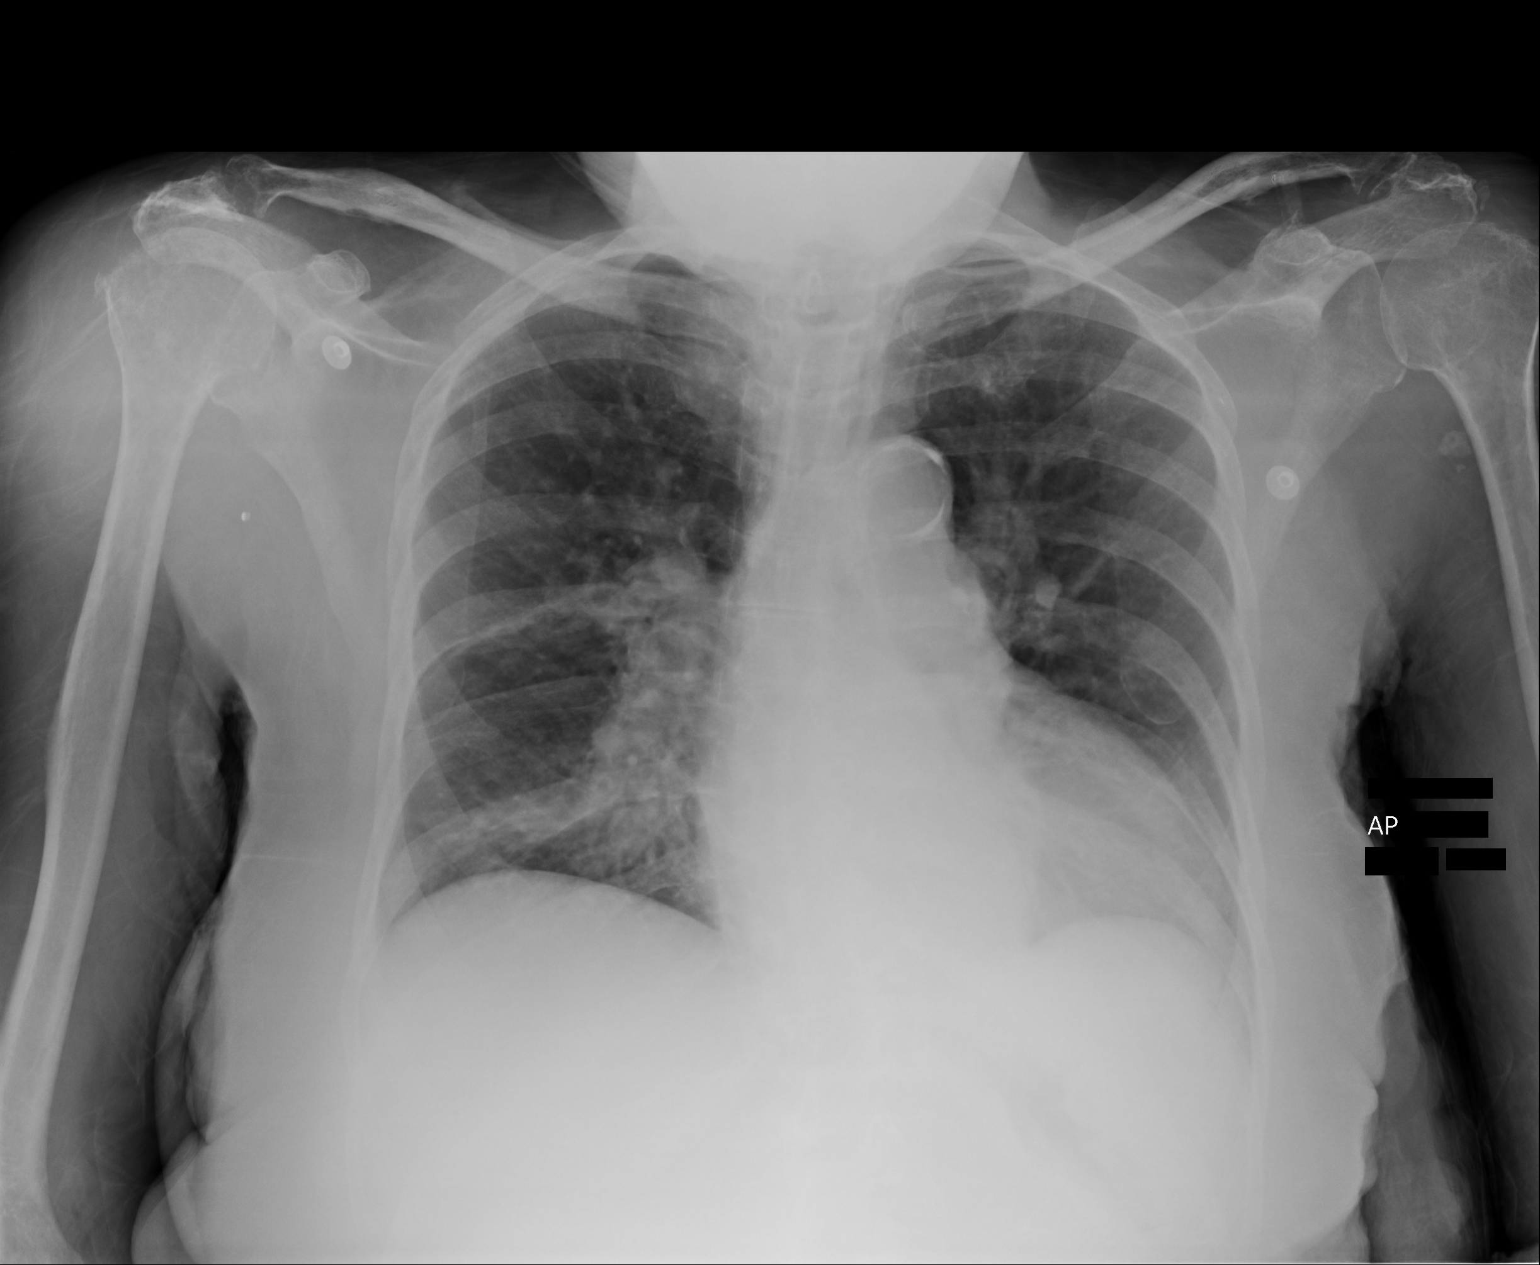

[1 of 1 positions shown; findings below may reference images not displayed]

PROCEDURE:     DXR - DXR PORTABLE CHEST SINGLE VIEW  - November 15, 2011 [DATE]

RESULT:     Comparison is made to the study 29 October, 2011.

Persistent increased density is seen in the right midlung and right lung
base consistent with areas of infiltrate versus atelectasis. The heart is
mildly enlarged. Atherosclerotic calcification is noted in the aortic arch.
Pulmonary vascular congestion is present.
IMPRESSION: Areas of bandlike density in the mid and lower right lung
which could represent atelectasis or infiltrate. Stable appearance
otherwise. Cardiomegaly noted.

## 2015-03-03 NOTE — Op Note (Signed)
PATIENT NAME:  Julia Oneill, Julia Oneill MR#:  [id number] DATE OF BIRTH:  [birth year]  DATE OF PROCEDURE:  02/09/2012  PREOPERATIVE DIAGNOSES:  1. Anemia.  2. Lack of appropriate access.   POSTOPERATIVE DIAGNOSES:  1. Anemia.  2. Lack of appropriate access.   PROCEDURE PERFORMED: Insertion of a right IJ triple lumen catheter.   SURGEON: Renford DillsGregory G. Mattia Liford, M.D.   DESCRIPTION OF PROCEDURE: The patient is in her hospital room. She is positioned supine. The right neck is prepped and draped in a sterile fashion. Ultrasound is placed in a sterile sleeve. Jugular vein is identified. It is echolucent, homogeneous, and easily compressible indicating patency. Image is recorded. Under direct continuous visualization, a micropuncture needle is inserted into the jugular vein, microwire followed microsheath, J-wire followed by the dilator and then the triple-lumen catheter. The triple-lumen catheter is then positioned approximately 3 cm external and is secured with 2-0 silk. A sterile dressing is applied. All three lumens aspirate and flush easily. Chest x-ray demonstrates the catheter is in excellent position with its tip in the superior vena cava. No evidence of hemopneumothorax. ____________________________ Renford DillsGregory G. Carvin Almas, MD ggs:slb D: 02/09/2012 11:48:17 ET T: 02/09/2012 14:47:19 ET JOB#: [id number]  cc: Renford DillsGregory G. Aprille Sawhney, MD, <Dictator> Renford DillsGREGORY G Kierria Feigenbaum MD ELECTRONICALLY SIGNED 02/10/2012 9:01

## 2015-03-03 NOTE — H&P (Signed)
PATIENT NAME:  Julia Oneill, Julia Oneill MR#:  [id number] DATE OF BIRTH:  19-Jun-1926  DATE OF ADMISSION:  02/05/2012  ADDENDUM:   The patient has no chest pain, no EKG changes, just elevated troponin. Was evaluated by Dr. Darrold JunkerParaschos in January. No further cardiac work-up was advised so we will trend the troponins and see if she has any bump in the troponins or any changes on the monitor.   ____________________________ Katha HammingSnehalatha Kaydi Kley, MD sk:drc D: 02/05/2012 21:39:23 ET T: 02/06/2012 08:50:55 ET JOB#: [id number]  cc: Katha HammingSnehalatha Darlyne Schmiesing, MD, <Dictator> Katha HammingSNEHALATHA Adelia Baptista MD ELECTRONICALLY SIGNED 02/07/2012 16:39

## 2015-03-03 NOTE — H&P (Signed)
PATIENT NAME:  Julia Oneill, Julia Oneill MR#:  [id number] DATE OF BIRTH:  01-13-1926  DATE OF ADMISSION:  11/15/2011  PRIMARY CARE PHYSICIAN:  Dr. Ellin Goodie NEPHROLOGIST: Dr. Candiss Norse HEMATOLOGIST:  Dr. Grayland Ormond   CHIEF COMPLAINT: History was obtained from the patient and the son. Complains of shortness of breath, chills, and cough.   HISTORY OF PRESENT ILLNESS: 81 year old female with multiple medical problems. She has history of chronic kidney disease stage IV with eGFR of around 18 with a baseline creatinine of 2.68, hypertension, anemia of chronic disease, diabetes, hyperlipidemia, severe aortic stenosis, chronic diastolic failure with severe aortic stenosis.  She was just discharged from the hospital on 11/03/2011. She stayed here from 12/21 to 11/03/2011 with acute respiratory failure, acute on chronic diastolic congestive heart failure, severe aortic stenosis, and acute on chronic anemia. She required blood transfusion at that time. She was discharged home with Hospice. She was discharged on home oxygen, but apparently her saturation improved and her home oxygen was discontinued a few days ago. Also as per the son, all the family members at home have been sick with flu-like symptoms. This morning she started developing increased shortness of breath. She was probably having some chills yesterday and also coughing for a few days but she could not produce any phlegm. She was brought in by EMS.  Her temperature was around 99.5 when she presented to the Emergency Room. She arrived on nonrebreather mask. She was saturating around 94% on 2 liters nasal cannula. She is complaining of wheezing, still short of breath, slightly nauseous but no vomiting. Denies any abdominal pain. Denies any diarrhea. As per the son, her weight fluctuates. She has been nonambulatory at home. She just bedbound, functional quadriplegic. She requires assistance in all activities. Her initial work-up in the Emergency Room showed that she  had a normal white count. She had infiltrate on the right side, bandlike density. Also she was found to be positive for influenza A. She has been given a dose of  750 mg of IV Levaquin in the Emergency Room along with 75 mg of Tamiflu.   REVIEW OF SYSTEMS: Positive for fever, weakness, and chills. No acute change in vision. No headache. No dizziness. Complaining of cough, dyspnea. She denies any painful respiration. No chest pain. She is complaining of shortness of breath. No palpitations or syncope. Complaining of some mild nausea but no vomiting, diarrhea, or gastrointestinal bleed.  No dysuria. No frequency. She has anemia. No bleeding from any site. No easy bruisability. No rash. No focal numbness or weakness or anxiety.   PAST MEDICAL HISTORY:  1. Chronic diastolic failure.  2. Severe aortic stenosis.  3. Multiple admissions in December, from 12/01 to 12/07 and then from 12/21 to 12/25  because of acute on chronic diastolic failure and acute respiratory failure. Last echocardiogram was on 10/10/2011 which showed ejection fraction of greater than 65%, mild concentric left ventricular hypertrophy, moderate to severe MR, right ventricular pressure 50 to 60 mm of mercury, and severe valvular aortic stenosis. 4. Hypertension.  5. Diabetes. 6. Hyperlipidemia.  7. Chronic kidney disease stage IV. Creatinine at discharge on December 25 was 2.68 with eGFR of around 18 at that time. She follows with Dr. Candiss Norse. 8. Anemia of chronic disease. She got Epogen last admission and also blood transfusion.  9. Hyperthyroidism status post ablation.  10. Vitamin D deficiency.  11. Gout.  12. Osteoarthritis with functional paraplegia secondary to pain.  13. History of MGUS.   PAST SURGICAL HISTORY:  None.   ALLERGIES: Penicillin.   HOME MEDICATIONS: Per last discharge:  1. Sodium bicarbonate 650 mg daily.  2. Senna Plus 1 tablet twice a day.  3. Flonase nasal spray, two sprays daily.  4. Vitamin C 500 mg  daily.  5. Multivitamin daily.  6. Zinc 220 mg daily. 7. Toprol XL 25 mg daily. 8. Bumex 2 mg b.i.d. 9. Nystatin powder twice a day on the groin. 10. Oxybutynin ER 5 mg daily. 11. Ferrous sulfate 325 mg b.i.d. 12. Prednisone 5 mg daily.  14. Prilosec 20 mg b.i.d.  15. Tylenol p.r.n. 16. Norco every four hours p.r.n.   SOCIAL HISTORY: She was previously living at [facility], but after her last discharge she was staying at home with her son. No smoking or alcohol use.   FAMILY HISTORY: No history of early coronary artery disease or kidney disease in the family as per previous records.   PHYSICAL EXAMINATION:  VITAL SIGNS: When she presented to the Emergency Room, temperature 99.5, heart rate of 109, respiratory rate 20, blood pressure 134/79, saturating 94% on 2 liters nasal cannula. Currently she is saturating at 97% on 2 liters nasal cannula.   GENERAL: This is an elderly Caucasian female, sick looking, frail appearance, poorly nourished, sitting upright in bed. She is having some audible wheezing and increased respiratory effort.   HEENT: Pupils equal. Extraocular muscles intact. No scleral icterus. No conjunctivitis. Oral mucosa is moist. Mild pallor.   NECK: No thyroid tenderness, enlargement or nodule. Neck is supple. No masses, nontender. No adenopathy. No JVD. No carotid bruit.   CHEST: She has bilateral coarse breath sounds and rhonchi, also wheezes. She has some increased respiratory effort and some mild audible wheezing. Not using accessory muscles of respiration.   HEART: Heart sounds are regular. There is a murmur, maybe mild lower extremity edema. Peripheral pulses are 1+.   ABDOMEN: Soft, nontender. Normal bowel sounds. No hepatosplenomegaly, no bruit, no masses.   RECTAL: Deferred.   NEUROLOGIC: She is awake and alert. She can move her bilateral lower extremities against gravity slightly.   EXTREMITIES: No cyanosis or clubbing.   SKIN: Appears to be dry.    LABS/STUDIES: White count 9.9, hemoglobin 8.4, platelet 257,000. BMP: Sodium 145, potassium 4.5, BUN 70, creatinine 3.02 with eGFR of around 19 today. Actually it was 22 on 11/03/2011. Her blood cultures have been sent. Influenza is positive for influenza A. Her chest x-ray shows a bandlike density in the mid and lower right lung, atelectasis versus infiltrate. Her EKG showed that she has sinus tachycardia, left axis deviation. She has nonspecific T-wave changes, unchanged from prior EKGs. Her troponin was found to be 0.13. In December her  troponin was in the range of 0.55 to 0.53. TIBC was 164 with iron saturation 35%.   IMPRESSION:  1. Influenza with right middle, right lower lobe pneumonia.  2. Acute on chronic renal failure, chronic kidney disease stage IV. 3. Anemia of chronic disease.  4. Chronic diastolic failure.  5. Severe aortic stenosis appears to be compensated at this time.  6. Hypertension.  7. Diabetes.  8. Hyperlipidemia.  9. History of pulmonary hypertension.  10. Elevated troponins most likely secondary to her poor renal clearance with hypoxia.  PLAN:  81 year old female with multiple medical problems. She has chronic diastolic failure, severe aortic stenosis, pulmonary hypertension, diabetes, hypertension, chronic kidney stage IV. She was discharged on 12/25 with acute on chronic diastolic failure. She arrives with some low-grade fever, shortness of breath,  and coughing. Influenza is positive.  She has an infiltrate in the right middle and right lower lobes.  I am going to start her on Tamiflu, dose to creatinine clearance. Her creatinine clearance is in the range of 19, so we will give her 75 daily for five days. She got one dose of Levaquin in the emergency room, 750 daily, and I am going to give her 500 q. 48 hours. Blood cultures have already been sent. We will continue her congestive heart failure regimen with low dose beta blocker and Bumex. Her kidney function can be  monitored. She is saturating well. She is 97% on 2 liters nasal cannula. I will give her some Duo-Nebs also. Her hemoglobin and creatinine and GFR should be monitored. Her hemoglobin right now is stable. It was around 8.6 to 9.3 in December and today it is 8.4. She is on iron tablets, which I am going to continue. Continuing proton pump inhibitor. As per the son, she had a capsule endoscopy done after her discharge last time and there is a possible bleed there, but  apparently the patient does not want any surgery or operative intervention at this time. The patient needs to follow up with Dr. Grayland Ormond for repeat dose of Epogen after discharge. There is   a portable DO NOT RESUSCITATE in the chart. I confirmed this with her son.  I am going to  continue her DO NOT RESUSCITATE status.  TIME SPENT ON ADMISSION AND COORDINATION OF CARE:  Around 55 minutes.   ____________________________ Mena Pauls, MD ag:bjt D: 11/15/2011 14:19:16 ET T: 11/15/2011 14:46:01 ET JOB#: [id number]  cc: Mena Pauls, MD, <Dictator> Ellamae Sia, MD Mena Pauls MD ELECTRONICALLY SIGNED 12/04/2011 10:17

## 2015-03-03 NOTE — Discharge Summary (Signed)
PATIENT NAME:  Julia Oneill, Julia Oneill MR#:  [id number] DATE OF BIRTH:  [birth year]  DATE OF ADMISSION:  10/30/2011 DATE OF DISCHARGE:  11/03/2011  PRIMARY CARE PHYSICIAN: Dr. Myrene GalasHarriet Burns  DIAGNOSES:  1. Acute respiratory failure.  2. Acute diastolic congestive heart failure.  3. Severe aortic stenosis.  4. Acute on chronic anemia.  5. MGUS.  6. Chronic kidney disease stage IV.  7. Hypotension.  8. Diabetes.  9. Acute gout.  10. Constipation.   MEDICATIONS ON DISCHARGE:  1. Sodium bicarbonate 650 mg daily.  2. Senna Plus 50/8.6, 1 tablet twice a day. 3. Flonase 50 mcg two sprays daily.  4. Tylenol 650 mg every six hours as needed for pain.  5. Norco 5/325, 1 tablet every four hours as needed for pain.  6. Vitamin C 500 mg p.o. daily.  7. Multivitamin 1 tablet p.o. daily.  8. Zinc 220 mg p.o. daily.  9. Toprol-XL 25 mg p.o. nightly.  10. Bumex 2 mg p.o. twice a day.  11. Nystatin powder twice a day on the groin.  12. Oxybutynin ER 5 mg p.o. daily.  13. Ferrous sulfate 325 mg p.o. twice a day.  14. Prednisone 5 mg p.o. daily.  15. Glipizide XL 2.5 mg p.o. daily.  16. Prilosec 20 mg p.o. twice a day.  17. Oxygen 2 liters nasal cannula.   ACTIVITY: As tolerated.   FOLLOWUP:   1. Follow up with Dr. Orlie DakinFinnegan of hematology in 1 to 2 weeks. 2. Follow up with Dr. Fulton MoleHarriett Burns in 1 to 2 weeks. Check hemoglobin on next visit.   REASON FOR ADMISSION: The patient was admitted 10/30/2011 and discharged 11/03/2011. The patient came in with shortness of breath.   HISTORY OF PRESENT ILLNESS: 81 year old woman with chronic kidney disease, severe aortic stenosis, congestive heart failure, pulmonary hypertension, anemia of chronic disease, MGUS, diabetes, and osteoarthritis who presents with shortness of breath. She was found to have a pulse oximetry of 75% on room air. She was admitted with acute respiratory failure, started on Bumex. For her anemia, she had a GI consult ordered.    CONSULTANTS:  1. Physical therapy.  2. Palliative care.  3. She was seen by GI on last admission and recommended outpatient followup.   LABORATORY, DIAGNOSTIC, AND RADIOLOGICAL DATA: BNP of 21,057, glucose 120, BUN 82, creatinine 2.9, sodium 140, potassium 4.8, chloride 104, CO2 25, calcium 9, albumin low at 2.4. White blood cell count 7.7, hemoglobin and hematocrit 5.8 and 17.3, platelet count 430, troponin 0.49. EKG: Sinus rhythm with premature atrial complexes, 75 beats per minute. Chest x-ray: Mild pulmonary edema, left basilar opacities may represent atelectasis and/or infection. Troponin 0.53, next troponin 0.55. TSH of 1.91. LDL 33, HDL 26, triglycerides 135. Hemoglobin A1c 5.7, hemoglobin upon discharge 9.3. Creatinine upon discharge 2.68 with a GFR of 22.   HOSPITAL COURSE PER PROBLEM LIST:  1. Acute respiratory failure: The patient came in hypoxic with a pulse oximetry of 75% on room air. The patient remained hypoxic during the hospitalization when taken off the oxygen. She is discharged home with Hospice on 2 liters nasal cannula  2. Acute diastolic congestive heart failure: She was placed on IV Bumex 2 mg IV b.i.d., was on that for most of the hospital stay. Switched over to 2 mg p.o. b.i.d. and remained stable and was discharged home. She is on low-dose Toprol. No ACE inhibitor secondary to severe aortic stenosis.  3. Severe aortic stenosis with combination of congestive heart failure: Overall prognosis is  poor. Hospice care set up at home.  4. Acute on chronic anemia: The patient was transfused 4 units of packed red blood cells during the hospital stay. IV Venofer was also given, Procrit injection also given. Ferrous sulfate upon discharge. Close clinical followup with Dr. Milinda Cave office to avoid hospitalizations will be needed. Also spoke with the family about not giving blood too as an option and just keeping her comfortable with Hospice care.  5. MGUS: Can follow up with Dr.  Orlie Dakin.  6. Chronic kidney disease, stage IV: Creatinine remained stable with diuresis.  7. Relative hypotension: The patient was able to tolerate a low-dose beta blocker at night, but no other blood pressure medications besides the Bumex. Blood pressure was actually borderline upon discharge, 156/85, but prior to that it was good at 138/78.  8. Diabetes:  Low-dose glipizide 2.5 mg. Need to watch sugars closely.  9. Gout: I will leave the patient chronically on prednisone 5 mg daily. Can probably taper to 2.5 mg daily as outpatient.  10. Constipation: Senna Plus given.  TIME SPENT ON DISCHARGE: 40 minutes.    ____________________________ Herschell Dimes. Renae Gloss, MD rjw:bjt D: 11/04/2011 16:59:11 ET T: 11/05/2011 13:32:24 ET JOB#: [id number]  cc: Herschell Dimes. Renae Gloss, MD, <Dictator> Hyman Hopes, MD Tollie Pizza. Orlie Dakin, MD Salley Scarlet MD ELECTRONICALLY SIGNED 11/25/2011 16:00

## 2015-03-03 NOTE — H&P (Signed)
PATIENT NAME:  Julia LovelessROGERS, Deyona E MR#:  [id number] DATE OF BIRTH:  01/15/1926  DATE OF ADMISSION:  02/05/2012  ADDENDUM:  The patient also has slight hyperkalemia but she did recent Kayexalate in the ER. Will check BMP tomorrow. Has no EKG changes.   ____________________________ Katha HammingSnehalatha Trust Leh, MD sk:drc D: 02/05/2012 21:42:53 ET T: 02/06/2012 08:54:16 ET JOB#: [id number]  cc: Katha HammingSnehalatha Synai Prettyman, MD, <Dictator> Katha HammingSNEHALATHA Vincen Bejar MD ELECTRONICALLY SIGNED 02/07/2012 16:44

## 2015-03-03 NOTE — Discharge Summary (Signed)
PATIENT NAME:  Julia LovelessROGERS, Pegi E MR#:  [id number] DATE OF BIRTH:  Oct 02, 1926  DATE OF ADMISSION:  02/05/2012 DATE OF DISCHARGE:  02/09/2012  DISCHARGE DIAGNOSES: 1. Shortness of breath due to acute on chronic diastolic failure.  2. Severe aortic stenosis.  3. Acute on chronic anemia likely due to worsening of chronic anemia status post transfusion of 2 units of packed red blood cells.  4. Chronic kidney disease, stage IV. Creatinine is currently close to baseline.  5. Diabetes.  6. Elevated troponin in a setting of poor renal clearance, chronic kidney disease, and severe aortic stenosis. She has been seen by cardiology. They have recommended no further work-up due to her multiple comorbidities.  7. Hyperlipidemia.  8. History of right shoulder arthritis.  9. Vitamin D deficiency.  10. Gout.  11. Osteoarthritis.  12. History of monoclonal gammopathy of undetermined significance.   PERTINENT LABS/EVALUATIONS: WBC count 5.5, hemoglobin 7.9, hematocrit 24.4, and platelet count 336. Sodium 142, potassium 5.3, chloride 115, bicarbonate 13, BUN 61, creatinine 2.72, and glucose 104. LFTs: AST 16, ALT 11, and albumin 1.5. BNP 37,404. Troponin 0.21.   EKG showed sinus rhythm with Q waves in leads V1 and V2. No ST-T wave changes.   The patient's creatinine this morning is 2.40. Her hemoglobin is 9.9.   HOSPITAL COURSE: Please see the history and physical done by the admitting physician. The patient is an 81 year old African American female who has required multiple hospitalizations to the hospital for congestive heart failure and who has hypertension, diabetes, severe aortic stenosis, chronic kidney disease, and anemia of chronic disease who presented to the hospital with shortness of breath. The patient was noted to be severely anemic. The plan was to transfuse her. However, she had lack of IV access. Therefore, we had to wait to get IV access. She underwent transfusion with 2 units of packed RBCs. Her  anemia played a significant role in her shortness of breath. After transfusion her breathing improved. She was also noted to have acute diastolic congestive heart failure and placed on IV Bumex with improvement in her symptoms. The patient is chronically ill and has multiple medical problems including critical aortic stenosis. She is DO NOT RESUSCITATE and is followed by hospice at home. At this time, she is stable for discharge.   DISCHARGE INSTRUCTIONS: The patient will weigh herself every day at the same time, preferably first thing in the morning after urinating and before eating, and she is to call her physician if she gains more than 2 pounds in one day or more than 5 pounds in one week, for increased weight gain, tiredness, swelling in the legs and feet, shortness of breath, urinating frequently, feeling faint, or chest pain or tightness, to call primary MD or 911.   DISCHARGE MEDICATIONS:  1. Tums 500 mg 1 tab three times daily. 2. Cefaclor 500 mg p.o. three times daily. 3. Sodium bicarbonate 650 mg daily.  4. Amlodipine mg 10 daily.  5. Fluticasone 50 mcg intranasally daily.  6. Vitamin D 1 tab p.o. daily.  7. Norco 5/325 mg 1 tab p.o. every 6 hours p.r.n. for pain as needed.  8. Isosorbide mononitrate 30 mg daily.   HOME OXYGEN: Yes at 2 liters nasal cannula.   DIET: Low sodium.   ACTIVITY: As tolerated.   REFERRAL: The patient is to resume home hospice.            TIMEFRAME FOR FOLLOW-UP: Followup in 1 to 2 weeks with Dr. Fulton MoleHarriett Burns.  TIME SPENT ON DISCHARGE: 35 minutes.  ____________________________ Lacie Scotts Allena Katz, MD shp:slb D: 02/09/2012 09:22:11 ET T: 02/09/2012 16:19:48 ET JOB#: [id number]  cc: Samayah Novinger H. Allena Katz, MD, <Dictator> Hyman Hopes, MD Charise Carwin MD ELECTRONICALLY SIGNED 02/12/2012 12:17

## 2015-03-03 NOTE — Consult Note (Signed)
Brief Consult Note: Diagnosis: Arthritis right shoulder with effusion.   Patient was seen by consultant.   Orders entered.   Comments: 81 year old female was admitted with multiple medical problems including Influenza A, renal failure, and diabetes with heart diasease.  complains of right shoulder swelling last few days without injury. She has bee n at home and non ambulatory.  No significant shoulder pain.   Exam: Alert and cooperative.  circulation/sensation/motor function good right arm.  Fluid palpable anterior right shoulder.  No reddness, tenderness, or warmth. range of motion somewhat restricted with some crepitus on motion.   Left shoulder normal in appearance.   X-rays: Mild-moderate degenerative arthritis right shoulder with effusion  Imp:  This appears to be a chronic effusion, possible related to lifting patient. Mild arthritis.   Rx:  Ice pack, sling        NSAIDs if clinically feasible, if not let me know and I will aspirate it.          No further studies indicated as she is not an operative candidate.  Electronic Signatures: Valinda HoarMiller, Montez Cuda E (MD)  (Signed 08-Jan-13 18:04)  Authored: Brief Consult Note   Last Updated: 08-Jan-13 18:04 by Valinda HoarMiller, Kyrese Gartman E (MD)

## 2015-03-03 NOTE — H&P (Signed)
PATIENT NAME:  DIONICIA, CERRITOS MR#:  [id number] DATE OF BIRTH:  February 04, 1926  DATE OF ADMISSION:  11/15/2011  ADDENDUM:  Since her kidney function is slightly worse with eGFR of 22 on 12/25 to eGFR of 19, I am  going to decrease her Bumex to 2 mg daily instead of b.i.d. and just follow her kidney function.    ____________________________ Mena Pauls, MD ag:bjt D: 11/15/2011 14:33:38 ET T: 11/15/2011 15:18:33 ET JOB#: [id number]  cc: Mena Pauls, MD, <Dictator> Mena Pauls MD ELECTRONICALLY SIGNED 12/04/2011 10:18

## 2015-03-03 NOTE — Consult Note (Signed)
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 Patient says that she is having trouble breathing on and off and gotten worse today so she came. She also said she had fever but in the ER temperature was 98.1. Denies any cough. Patient denies any fatigue. Has no chest pain. Patient was admitted from 01/06 to 01/09 for influenza pneumonia. Patient was doing fine till recently and today she had trouble breathing and came in. Denies any blood loss from stools. No chest pain. No other symptoms. Multiple attempts have been made to obtain IV acces which were unsuccessful.  I am asked to evaluate.  PAST MEDICAL HISTORY:  1. Multiple hospitalizations 12/01 to 12/07, 12/21 to 12/25 and 01/06 to 01/09.  2. Chronic diastolic heart failure. 3. Chronic kidney disease stage IV. 4. Hypertension. 5. Diabetes. 6. Hyperlipidemia. 7. Anemia of chronic disease required blood transfusion during the last admission in January.  8. History of cardiac enzymes elevation during admission in January; troponin went up to 0.27 and echo was 50% in December.  9. History of right shoulder arthritis. 10. Vitamin D deficiency. 11. Gout. 12. Osteoarthritis.   Home Medications: Medication Instructions Status  Tums 500 mg oral tablet, chewable 1 tab(s) orally 3 times a day Active  cefaclor 500 mg oral capsule 1 cap(s) orally 3 times a day Active  sodium bicarbonate 650 mg oral tablet 1 tab(s) orally once a day Active  amlodipine 10 mg oral tablet 1 tab(s) orally once a day Active  fluticasone 50 mcg/inh nasal spray 1 spray(s) nasal once a day Active  Vital-D oral tablet 1  tab(s) orally once a day Active  Norco 5 mg-325 mg oral tablet 1 tab(s) orally every 6 hours, As Needed Active  isosorbide mononitrate 30 mg oral tablet, extended release 1 tab(s) orally once a day (in the morning) Active    Penicillin: Rash, Hives  Case History:   Social History negative tobacco, negative ETOH, negative Illicit drugs   Review of Systems:   Fever/Chills No    Cough No    Sputum No    Abdominal Pain No    Diarrhea No    Constipation No    Nausea/Vomiting No    SOB/DOE No    Chest Pain No    Telemetry Reviewed NSR    Dysuria No   Physical Exam:   GEN WD, WN, thin    HEENT PERRL, hearing intact to voice, dry oral mucosa    NECK supple  trachea midline    RESP normal resp effort  no use of accessory muscles    CARD regular rate  No LE edema  no JVD    ABD denies tenderness  soft    EXTR negative cyanosis/clubbing, negative edema    SKIN No rashes, skin turgor good    NEURO cranial nerves intact, follows commands, motor/sensory function intact    PSYCH alert, A+O to time, place, person   Nursing/Ancillary Notes: **Vital Signs.:   31-Mar-13 12:09   Vital Signs Type Routine   Temperature Temperature (F) 98.6   Celsius 37   Temperature Source oral   Pulse Pulse 91   Pulse source per Dinamap   Respirations  Respirations 18   Systolic BP Systolic BP 165   Diastolic BP (mmHg) Diastolic BP (mmHg) 79   Mean BP 92   BP Source Dinamap   Pulse Ox % Pulse Ox % 94   Pulse Ox Activity Level  At rest   Oxygen Delivery 2L   Routine BB:  31-Mar-13 13:33    Antibody Screen NEGATIVE  Routine Chem:  31-Mar-13 14:00    Glucose, Serum 145   BUN 62   Creatinine (comp) 2.50   Sodium, Serum 142   Potassium, Serum 4.4   Chloride, Serum 111   CO2, Serum 20   Calcium (Total), Serum 7.7   Osmolality (calc) 303   eGFR (African American) 24   eGFR (Non-African American) 20   Anion Gap 11  Routine Hem:  31-Mar-13 14:00    WBC (CBC) 4.8   RBC  (CBC) 2.13   Hemoglobin (CBC) 6.3   Hematocrit (CBC) 19.1   Platelet Count (CBC) 362   MCV 90   MCH 29.5   MCHC 33.0   RDW 16.2   Neutrophil % 86.2   Lymphocyte % 11.8   Monocyte % 1.0   Eosinophil % 0.7   Basophil % 0.3   Neutrophil # 4.2   Lymphocyte # 0.6   Monocyte # 0.0   Eosinophil # 0.0   Basophil # 0.0     Impression 1.  Lack of appropriate IV access          will plan right IJ TLC with [place] 2.  anemia of chronic disease           transufsion ordered 3.  DJD          NSAIDS as tolerated    Plan level 3   Electronic Signatures: Hortencia Pilar (MD)  (Signed 03-Apr-13 09:23)  Authored: General Aspect/Present Illness, Home Medications, Allergies, History and Physical Exam, Vital Signs, Labs, Impression/Plan   Last Updated: 03-Apr-13 09:23 by Hortencia Pilar (MD)

## 2015-03-03 NOTE — Discharge Summary (Signed)
PATIENT NAME:  Julia Oneill, Julia Oneill MR#:  [id number] DATE OF BIRTH:  [birth year]  DATE OF ADMISSION:  11/15/2011 DATE OF DISCHARGE:  11/18/2011  DIAGNOSES:  1. Influenza A with pneumonia. 2. Chronic diastolic failure. 3. Chronic kidney disease, stage IV. 4. Hypertension. 5. Diabetes. 6. Hyperlipidemia. 7. Anemia of chronic disease, status post packed RBC transfusion. 8. Elevated cardiac enzymes related to poor renal clearance and congestive heart failure.  9. Severe aortic stenosis. 10. Vitamin D deficiency.  11. Gout.  12. Osteoarthritis.  13. History of MGUS.   CONSULTATIONS:  1. Cardiology, Dr. Saralyn Pilar  2. Wound consult    HOSPITAL COURSE: The patient is an 81 year old female who has multiple medical problems. She has chronic diastolic failure, severe aortic stenosis, hypertension, diabetes, hyperlipidemia, chronic kidney disease stage IV with baseline creatinine around 2.7, and anemia of chronic disease. She was recently discharged from the hospital. She was here from December 1st to December 7th and from December 21st to December 25th. She presented with shortness of breath, chills, and cough. She started having chills. Her temperature was 99.5 in the Emergency Room. She initially presented with a nonrebreather mask in the Emergency Room but then she was saturating 94% on 2 liters nasal cannula. Her work-up in the Emergency Room showed that she had a normal white count and she was found to be Influenza A positive. Her chest x-ray showed that she had a bandlike density in the right middle and right lower lobe. Her creatinine when she came in was in the range of 3.02 with a BUN of 70 and an eGFR of 19, slightly worse than her baseline. She was started on Tamiflu dosed to creatinine clearance 75 mg once daily, one dose of Levaquin 750 mg, and then she was started on Levaquin 500 mg q.48 hours and DuoNebs. She significantly improved. She is now saturating 100% on 2 liters nasal cannula. Her  wheezing has resolved. Her white count has remained stable in the range of 7. Her creatinine improved to baseline around 2.72 with an eGFR of 21. Her hemoglobin has been slightly dropping. She received blood transfusion in the past. Her hemoglobin on December 25th was 9.3 and her hemoglobin when she came in was in the range of 8.4. It dropped to 7.8. Discussed with Dr. Grayland Ormond curbside, gave her 1 unit of packed RBC transfusion. Hemoglobin at discharge is 8.8. This needs to be followed up as outpatient. Last time she was discharged home with Hospice. The son has opted for another Hospice, [facility], so that has been arranged. Her blood cultures have been negative. She saturates 92% on room air. Her EKG showed sinus tachycardia when she came in, Q waves in inferior leads, some ST changes in the lateral leads, unchanged from prior EKGs. She also had some elevated cardiac enzymes. Her troponin went up to 0.27 from 0.13. Dr. Saralyn Pilar saw her and said this is not acute coronary syndrome. No change in her EKG and no further cardiac evaluation was needed. She had an echocardiogram done in December of 2012 which showed that she had EF of greater than 55%, mild LVH, right ventricular pressure elevated at 50 to 60 mmHg. She had some right shoulder effusion. A shoulder x-ray was done and the shoulder x-ray showed that she had probable calcific tendinitis, abnormal appearance of the right acromioclavicular joint. Orthopedics was consulted. She had mild to moderate degenerative arthritis of the right shoulder with effusion. This appears to be chronic. He suggested ice pack or sling  and NSAIDs if clinically feasible but NSAIDs cannot be given to the patient because of her anemia and chronic kidney disease. The patient cannot be given aspirin because of her chronic anemia and drop in hemoglobin.   MEDICATIONS AT DISCHARGE (HOME MEDICATIONS):  1. Sodium bicarbonate 650 mg daily.  2. Tylenol Arthritis 650 mg 2 tablets  q.6 hours as needed.  3. Norco 5/325 1 tab q.4 hours p.r.n.  4. Vitamin C 500 mg daily.  5. Multivitamin daily.  6. Zinc sulfate 220 mg daily.  7. Toprol XL 25 mg daily. 8. Bumex 2 mg b.i.d.  9. Nystatin twice a day topically. 10. Oxybutynin 5 mg daily. 11. Ferrous sulfate 325 mg b.i.d. 12. Prednisone 5 mg daily. 13. Glipizide 2.5 mg daily.  14. Prilosec 20 mg b.i.d.  15. Senna Plus twice a day.  16. Fluticasone nasal spray 2 inhaled once a day in the morning.   NEW MEDICATIONS:  1. Tamiflu 75 mg p.o. once daily for two days. 2. Levaquin 500 mg p.o. every other day, total of four doses.   TREATMENTS: Ice packs to the right shoulder, sling to the right shoulder when out of bed, may remove it to bathe.   DRESSING CARE: Dressing to pressure ulcer. Clean wound with SAF-Clens spray and dry with gauze. Apply calcium alginate to open areas and cover entire area with thin hydrocolloid dressing. Dressing to be changed every other day or more frequently if needed. Reposition frequently.  CONDITION AT DISCHARGE: She is chronically sick but feels better. T-max 98.2, heart rate 75, blood pressure 133/84, saturating 100% on 2 liters. Chest is clear. Heart sounds are regular. Abdomen soft, nontender, pale. She has chronic arthritis of her right shoulder with effusion.   DIET: Low sodium, ADA diet.   FOLLOW-UP:  1. The patient is to follow-up with Dr. Kingsley Spittle in one week. Follow-up hemoglobin and BMP in Dr. Eilleen Kempf office.  2. Follow-up with Dr. Grayland Ormond in two weeks.  3. Follow-up with Dr. Candiss Norse in 3 to 4 weeks.  REFERRAL: Home with Hospice.   TIME SPENT WITH DISCHARGE: 60 minutes.   ____________________________ Mena Pauls, MD ag:drc D: 11/18/2011 14:57:38 ET T: 11/20/2011 11:07:48 ET JOB#: [id number]  cc: Mena Pauls, MD, <Dictator>, Ellamae Sia, MD, Kathlene November. Grayland Ormond, MD, Murlean Iba, MD Mena Pauls MD ELECTRONICALLY SIGNED 12/04/2011 10:25

## 2015-03-03 NOTE — Discharge Summary (Signed)
PATIENT NAME:  Julia Oneill, Julia Oneill MR#:  [id number] DATE OF BIRTH:  [birth year]  DATE OF ADMISSION:  11/15/2011 DATE OF DISCHARGE:  11/18/2011  ADDENDUM:  The patient is getting discharged on home oxygen at 2 liters per minute. [facility] has been arranged for the patient.   Please make a change to her home medication, Tylenol is 650 mg one tablet q.6 hours as needed.    ____________________________ Fredia SorrowAbhinav Judaea Burgoon, MD ag:rbg D: 11/18/2011 14:59:57 ET T: 11/19/2011 16:28:52 ET JOB#: [id number]  cc: Fredia SorrowAbhinav Wendell Nicoson, MD, <Dictator> Hyman HopesHarriett P. Burns, MD Fredia SorrowABHINAV Shafin Pollio MD ELECTRONICALLY SIGNED 12/04/2011 10:22

## 2015-03-03 NOTE — H&P (Signed)
PATIENT NAME:  Julia Oneill, Julia Oneill MR#:  [id number] DATE OF BIRTH:  July 13, 1926  DATE OF ADMISSION:  02/05/2012  PRIMARY CARE PHYSICIAN: Dr. Fulton MoleHarriett Burns  ER PHYSICIAN: Dr. Margarita GrizzleWoodruff   NEPHROLOGIST: Dr. Cherylann RatelLateef ONCOLOGIST: Dr. Orlie DakinFinnegan   CHIEF COMPLAINT: Trouble breathing.   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 Patient says that she is having trouble breathing on and off and gotten worse today so she came. She also said she had fever but in the ER temperature was 98.1. Denies any cough. Patient denies any fatigue. Has no chest pain. Patient was admitted from 01/06 to 01/09 for influenza pneumonia. Patient was doing fine till recently and today she had trouble breathing and came in. Denies any blood loss from stools. No chest pain. No other symptoms.   PAST MEDICAL HISTORY:  1. Multiple hospitalizations 12/01 to 12/07, 12/21 to 12/25 and 01/06 to 01/09.  2. Chronic diastolic heart failure. 3. Chronic kidney disease stage IV. 4. Hypertension. 5. Diabetes. 6. Hyperlipidemia. 7. Anemia of chronic disease required blood transfusion during the last admission in January.  8. History of cardiac enzymes elevation during admission in January; troponin went up to 0.27 and echo was 50% in December.  9. History of right shoulder arthritis. 10. Vitamin D deficiency. 11. Gout. 12. Osteoarthritis.  13. History of MGUS.   DISCHARGE MEDICATIONS:  1. Sodium bicarbonate 650 mg daily.  2. Tylenol arthritis as needed 650 mg 2 tablets. 3. Norco 5/325, 1 tablet q.4 hours as needed. 4. Vitamin C 500 mg daily. 5. Multivitamin daily.  6. Zinc sulfate 220 mg daily.  7. Toprol-XL  25 mg p.o. daily.  8. Bumex 2 mg p.o. b.i.d.  9. Nystatin twice daily.  10. Oxybutynin 5 mg daily.  11. Ferrous sulfate 325 mg p.o. b.i.d.  12. Prednisone 5 mg daily. 13. Glipizide 2.5 mg p.o. daily.  14. Prozac 20 mg p.o. b.i.d.  15. Fluticasone nasal spray.   NOTE: Patient was discharged home with hospice last time.   ALLERGIES: Penicillin.   SOCIAL HISTORY: [facility], now she is at home with her son. No smoking. No drinking.   FAMILY HISTORY: Significant for history of hypertension but no history of coronary artery disease or kidney disease.   REVIEW OF SYSTEMS: CONSTITUTIONAL: Fever according to her. EYES: No blurred vision. ENT: No tinnitus. No epistaxis. No difficulty swallowing. RESPIRATORY: Has trouble breathing. CARDIOVASCULAR: No chest pain. No orthopnea. No pedal edema. GASTROINTESTINAL: No nausea. No vomiting. No abdominal pain. GENITOURINARY: Has chronic Foley catheter. ENDOCRINE: No polyuria or nocturia. HEMATOLOGIC: Has anemia of chronic disease. Patient denies any easy bruising. INTEGUMENT: No skin rashes. MUSCULOSKELETAL: Has joint pains and gout. NEUROLOGIC: Has generalized weakness but no dysarthria, transient ischemic attack. PSYCH: No insomnia or anxiety.     PHYSICAL EXAMINATION:  VITAL SIGNS: Temporary 98.1, pulse 91, respirations 20, blood pressure 122/73, saturation 98% on 2 liters.   GENERAL: Patient is alert, awake, oriented, answering questions appropriately.   HEENT: Head atraumatic, normocephalic. Pupils are equally reacting to light. Extraocular movements are intact. ENT: Patient has no tympanic membrane congestion. Mild turbinate hypertrophy present. No oropharyngeal erythema.   NECK: Normal range of motion. No JVD. No carotid bruit.  CARDIOVASCULAR: S1, S2 regular. Ejection systolic murmur present in aortic area.   LUNGS: Bilaterally decreased breath sounds in the bases. Not using accessory muscles of respiration. Has no  wheezing.   ABDOMEN: Soft, nontender, nondistended. Bowel sounds present.   EXTREMITIES: No extremity edema. Peripheral pulses are intact.   GENITOURINARY: Patient has Foley present.    NEUROLOGIC: Alert, awake, oriented, unable to move legs against gravity, has generalized deconditioning and functional paraplegia.   EXTREMITIES: No cyanosis. No clubbing.   LABORATORY, DIAGNOSTIC AND RADIOLOGICAL DATA: WBC 5.5, hemoglobin 7.9, hematocrit 24.4, platelets 336. Electrolytes: Sodium 142, potassium 5.3, chloride 115, bicarbonate 13, BUN 61, creatinine 2.72, glucose 104. LFTs: AST 16, ALT 11, albumin 1.5. BNP 37,404. Troponin 0.21. EKG showed normal sinus rhythm with Q waves in V1 and V2. No ST-T changes. Iron level slightly low around 26 on 02/13. Bicarbonate on 01/09 was around 25, creatinine 2.72 baseline and BUN 62 and baseline hemoglobin was 8.9 on 02/28.   ASSESSMENT AND PLAN: This is an 81 year old female with multiple medical problems of hypertension, chronic kidney disease stage IV, diastolic heart failure came with:  1. Trouble breathing with elevated BNP and chest x-ray showing pulmonary edema. Has acute on chronic diastolic heart failure. She is on Bumex at home. We are going to continue the Bumex. She already received Lasix in the Emergency Room. Continue with Bumex. Monitor ins and outs and also daily weights and obtain nephrology consult because of her renal failure and also heart failure. Had an echo done in December with ejection fraction more than 55% that is less than six months so we will not repeat echo. Cannot give any aspirin due to anemia. continue bblocker and bumex 2. Acute on chronic anemia. Hemoglobin around 8.9. Check stool guaiacs. Continue PPIs and iron supplements. Sees Dr. Orlie Dakin, recently saw him. Patient also has history of MGUS so will transfuse 1 unit and recheck the hemoglobin tomorrow and check the stool for guaiac.  3. Diabetes mellitus. Patient has been on  glipizide very low dose, continue that.  4. . Patient is getting Tylenol Arthritis and Norco; continue them for arthritis.  5. Overall prognosis poor because of multiple medical problems 6.  low bicarbonate. She is on bicarbonate tablets along with TUMS. Continue them and recheck the BMP tomorrow. Condition stable.   TOTAL TIME SPENT ON HISTORY AND PHYSICAL: 65 minutes.    ____________________________ Katha Hamming, MD sk:cms D: 02/05/2012 21:38:33 ET T: 02/06/2012 08:51:28 ET JOB#: [id number]  cc: Katha Hamming, MD, <Dictator> Hyman Hopes, MD Katha Hamming MD ELECTRONICALLY SIGNED 02/07/2012 16:41

## 2015-03-03 NOTE — Consult Note (Signed)
PATIENT NAME:  Julia Oneill, Julia Oneill MR#:  [id number] DATE OF BIRTH:  1926/09/19  DATE OF CONSULTATION:  11/16/2011  REFERRING PHYSICIAN:  Fredia SorrowAbhinav Gupta, MD  CONSULTING PHYSICIAN:  Marcina MillardAlexander Graciemae Delisle, MD  PRIMARY CARE PHYSICIAN: Hyman HopesHarriett P. Burns, MD  NEPHROLOGIST: Mosetta PigeonHarmeet Singh, MD  REASON FOR CONSULTATION: Elevated troponin.   CHIEF COMPLAINT: Shortness of breath, chills and cough.   HISTORY OF PRESENT ILLNESS: The patient is an 81 year old female with known history of chronic kidney disease, hypertension, severe aortic stenosis. She was recently discharged with respiratory failure, felt to be multifactorial. She was discharged home with hospice following blood transfusion on chronic oxygen therapy. While at home the patient developed fever and chills, cough, and shortness of breath. [facility] where she was noted to have mild fever, shortness of breath, nausea, and wheezing. The patient was positive for influenza A and was treated with IV Levaquin and 75 mg of Tamiflu. The patient has borderline elevated troponin of 0.27. She denies chest pain and there are no ischemic ECG changes.   PAST MEDICAL HISTORY:  1. Severe aortic stenosis.  2. History of chronic diastolic heart failure.  3. Hypertension.  4. Diabetes.  5. Hyperlipidemia.  6. Chronic kidney disease.  7. Chronic anemia.  8. Hyperthyroidism status post ablation.   MEDICATIONS:  1. Toprol-XL 25 mg daily. 2. Sodium bicarbonate 650 mg daily.  3. Senna Plus 1 tablet twice. 4. Flonase nasal spray two sprays daily.  5. Multivitamin 1 daily.  6. Zinc 220 mg daily.  7. Bumex 2 mg b.i.d.  8. Nystatin powder b.i.d.  9. Oxybutynin ER 5 mg daily.  10. Ferrous sulfate 325 mg b.i.d.  11. Prednisone 5 mg daily.  12. Prilosec 20 mg b.i.d.  13. Norco q.4 p.r.n.   SOCIAL HISTORY: [facility] previously living with her son. She denies tobacco or EtOH abuse.   FAMILY  HISTORY: No immediate family history of myocardial infarction or coronary artery disease.   PHYSICAL EXAMINATION:  VITAL SIGNS: Blood pressure 135/76, pulse 67, respirations 20, temperature 97.6, pulse oximetry 99%.   HEENT: Pupils equal and reactive to light and accommodation.   NECK: Supple without thyromegaly.   LUNGS: Clear.   HEART: Normal jugular venous pressure. Normal point of maximal impulse. Regular rate and rhythm. Normal S1, S2. No appreciable gallop, murmur, or rub.   ABDOMEN: Soft and nontender. Pulses were intact bilaterally.   MUSCULOSKELETAL: Normal muscle tone.   NEUROLOGIC: The patient is alert and oriented x3. Motor and sensory both grossly intact.   IMPRESSION: This is an 81 year old female who presents with influenza A with multiple medical problems with borderline elevated troponin in the absence of chest pain or ECG. I do not feel this is likely due to acute coronary syndrome.   RECOMMENDATIONS:  1. I agree with current therapy.  2. Would defer full dose anticoagulation.  3. Would defer further noninvasive or invasive cardiac evaluation at this time.  ____________________________ Marcina MillardAlexander Danai Gotto, MD ap:rbg D: 11/16/2011 17:27:30 ET T: 11/17/2011 10:00:35 ET JOB#: [id number]  cc: Marcina MillardAlexander Zaryah Seckel, MD, <Dictator> Marcina MillardALEXANDER Dresden Ament MD ELECTRONICALLY SIGNED 11/21/2011 10:04
